# Patient Record
Sex: Female | Born: 1960 | Race: Black or African American | Hispanic: No | Marital: Married | Smoking: Never smoker
Health system: Southern US, Community
[De-identification: ages and names within clinical notes are randomized; demographics above are authoritative.]

## PROBLEM LIST (undated history)

## (undated) DIAGNOSIS — N200 Calculus of kidney: Secondary | ICD-10-CM

## (undated) DIAGNOSIS — E079 Disorder of thyroid, unspecified: Secondary | ICD-10-CM

## (undated) DIAGNOSIS — E0500 Thyrotoxicosis with diffuse goiter without thyrotoxic crisis or storm: Secondary | ICD-10-CM

## (undated) DIAGNOSIS — I10 Essential (primary) hypertension: Secondary | ICD-10-CM

## (undated) HISTORY — PX: LITHOTRIPSY: SUR834

## (undated) HISTORY — PX: FOOT SURGERY: SHX648

---

## 2005-04-07 ENCOUNTER — Emergency Department: Payer: Self-pay | Admitting: Emergency Medicine

## 2005-05-23 ENCOUNTER — Emergency Department: Payer: Self-pay | Admitting: Emergency Medicine

## 2005-06-25 ENCOUNTER — Emergency Department: Payer: Self-pay | Admitting: Emergency Medicine

## 2005-10-23 ENCOUNTER — Emergency Department: Payer: Self-pay | Admitting: Emergency Medicine

## 2005-11-23 IMAGING — CR LEFT WRIST - COMPLETE 3+ VIEW
1 series · 2 of 2 positions shown · non-contrast
Comparison: none

REASON FOR EXAM: Painful wrist
COMMENTS:

PROCEDURE:     DXR - DXR WRIST LT COMP WITH OBLIQUES  - June 26, 2005  [DATE]
RESULT:     Multiple views reveal no fractures or dislocations. The joint
spaces are intact.

[Series 1: view not recorded · 0.17mm/px · 2 of 2 slices shown]
[im 1/2]
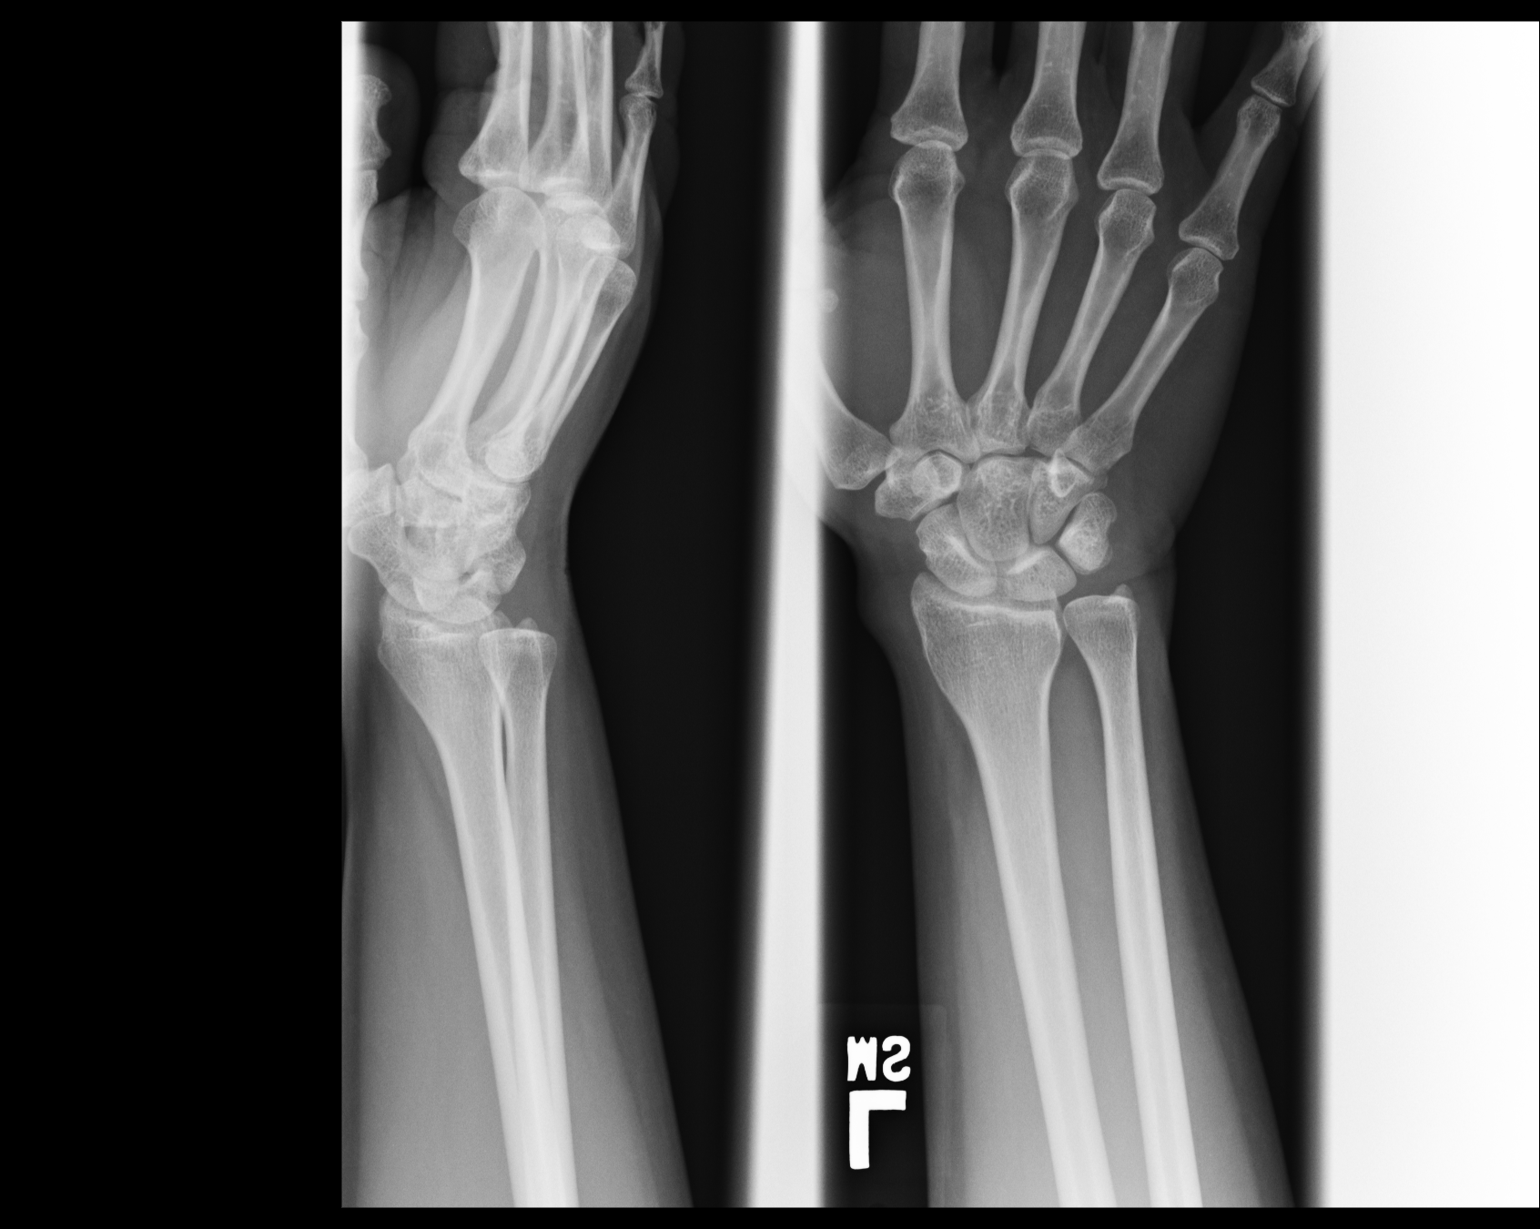
[im 2/2]
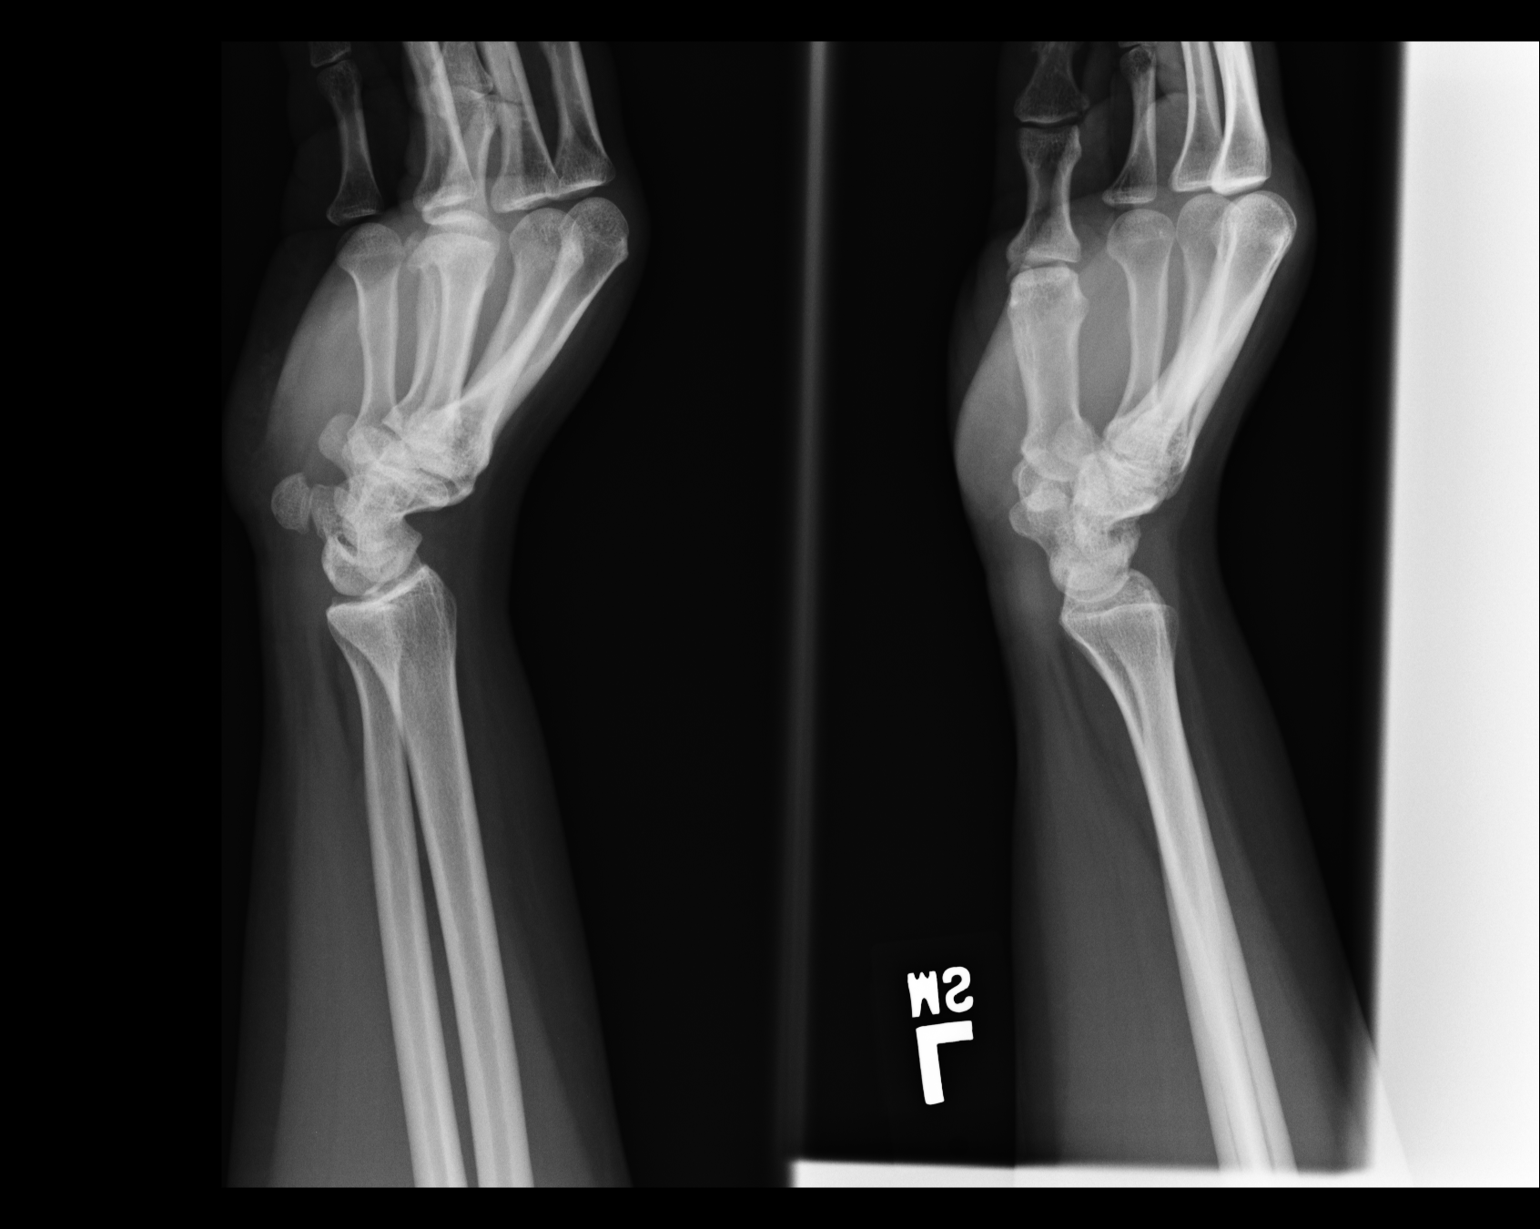

[2 of 2 positions shown; findings below may reference images not displayed]

IMPRESSION: No acute fracture is noted of the LEFT wrist.

## 2006-01-18 ENCOUNTER — Emergency Department: Payer: Self-pay | Admitting: Emergency Medicine

## 2006-02-07 ENCOUNTER — Emergency Department: Payer: Self-pay | Admitting: General Practice

## 2006-05-02 ENCOUNTER — Emergency Department (HOSPITAL_COMMUNITY): Admission: EM | Admit: 2006-05-02 | Discharge: 2006-05-02 | Payer: Self-pay | Admitting: Emergency Medicine

## 2007-07-20 ENCOUNTER — Emergency Department: Payer: Self-pay | Admitting: Emergency Medicine

## 2007-08-07 ENCOUNTER — Emergency Department: Payer: Self-pay | Admitting: Emergency Medicine

## 2008-02-19 ENCOUNTER — Emergency Department: Payer: Self-pay | Admitting: Emergency Medicine

## 2008-03-19 ENCOUNTER — Emergency Department: Payer: Self-pay | Admitting: Emergency Medicine

## 2008-07-08 ENCOUNTER — Emergency Department: Payer: Self-pay | Admitting: Emergency Medicine

## 2008-07-25 ENCOUNTER — Emergency Department: Payer: Self-pay | Admitting: Emergency Medicine

## 2008-08-16 ENCOUNTER — Emergency Department: Payer: Self-pay | Admitting: Emergency Medicine

## 2008-10-01 ENCOUNTER — Emergency Department: Payer: Self-pay | Admitting: Unknown Physician Specialty

## 2008-11-07 ENCOUNTER — Emergency Department: Payer: Self-pay | Admitting: Emergency Medicine

## 2009-01-13 IMAGING — CT CT ABD-PELV W/O CM
1 of 2 series · 15 of 32 positions shown, 19 images · non-contrast
Comparison: none

REASON FOR EXAM: (1) abd pain pt in spec 2; (2) abd pain pt in spec 2
COMMENTS:

[Series 2: stone · axial · 0.74mm/px · z∈[-934,-541]mm · 15 of 143 slices shown, 19 images]
[im 6/143  soft-tissue]
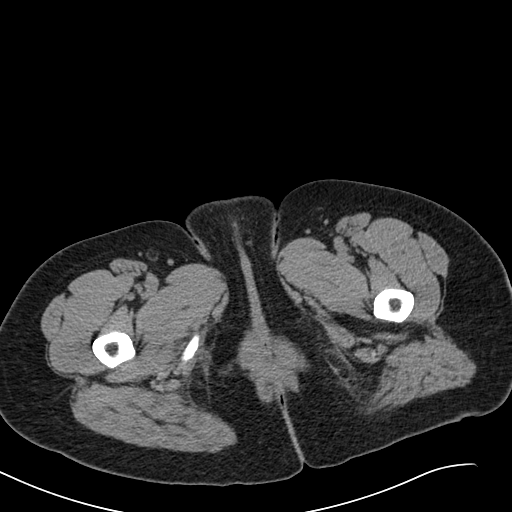
[im 6/143  bone]
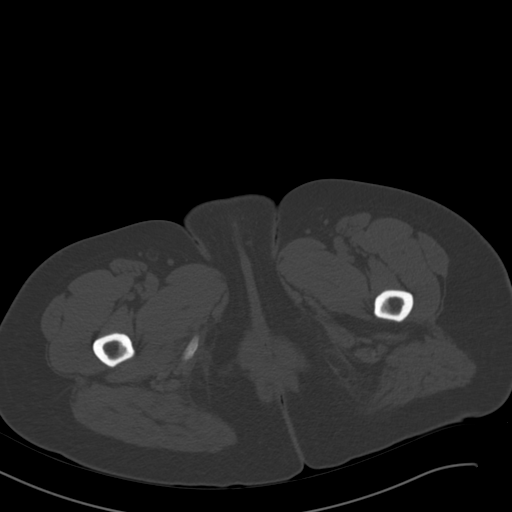
[im 18/143  soft-tissue]
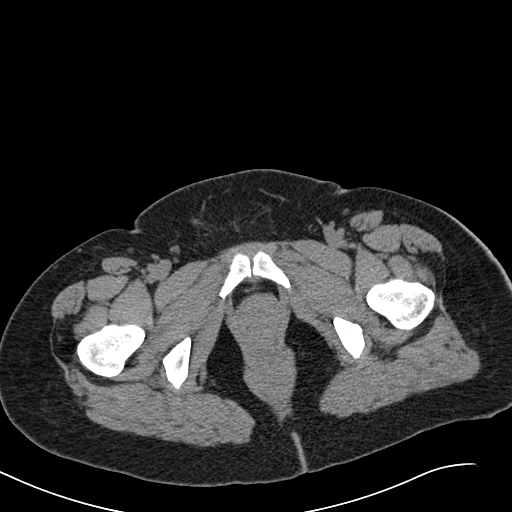
[im 29/143  soft-tissue]
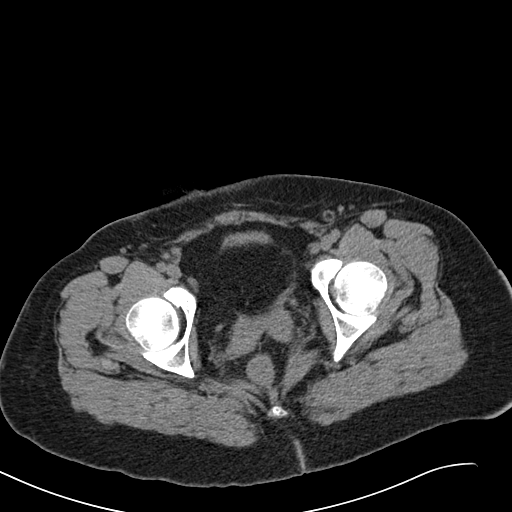
[im 40/143  soft-tissue]
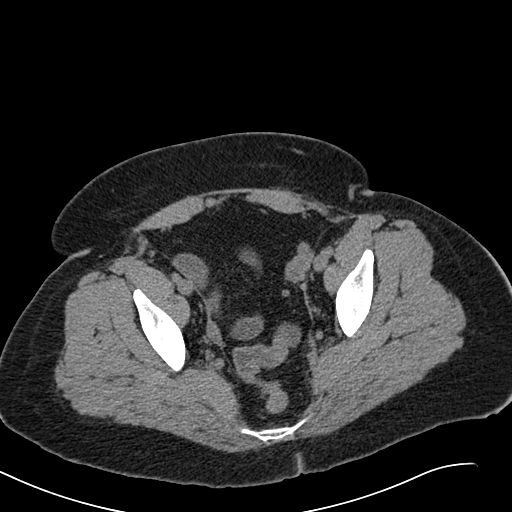
[im 52/143  soft-tissue]
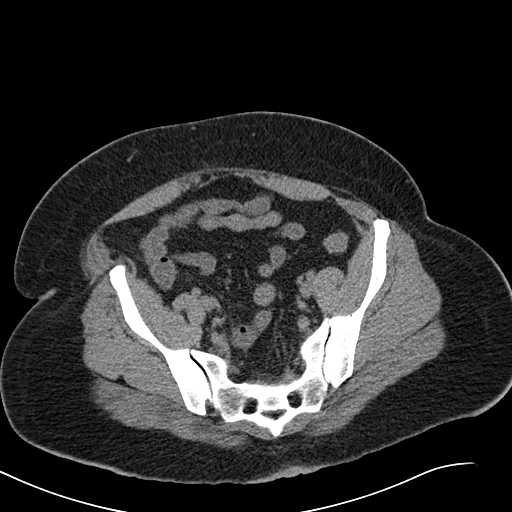
[im 63/143  soft-tissue]
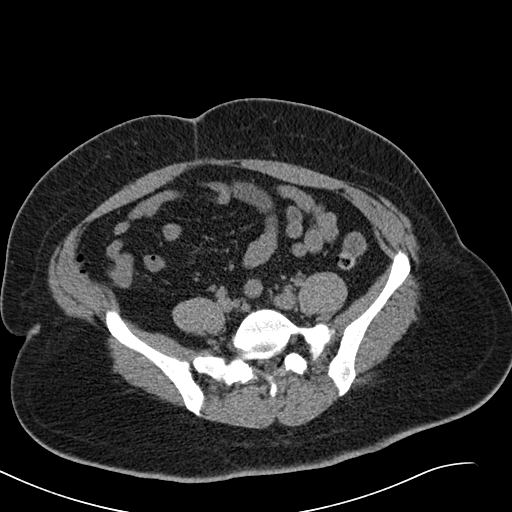
[im 74/143  soft-tissue]
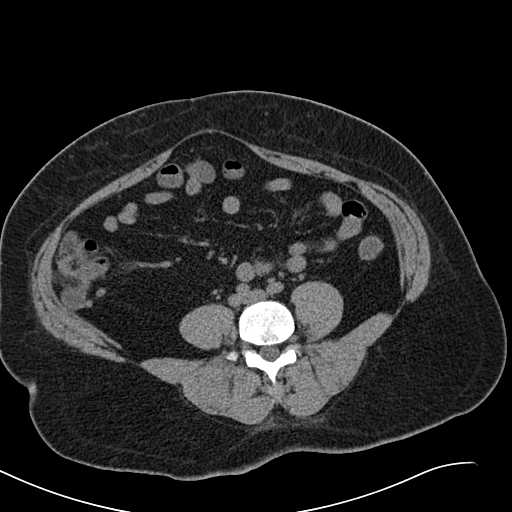
[im 80/143  soft-tissue]
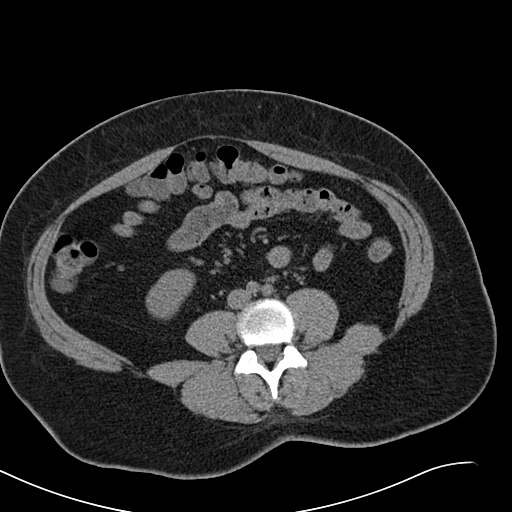
[im 91/143  soft-tissue]
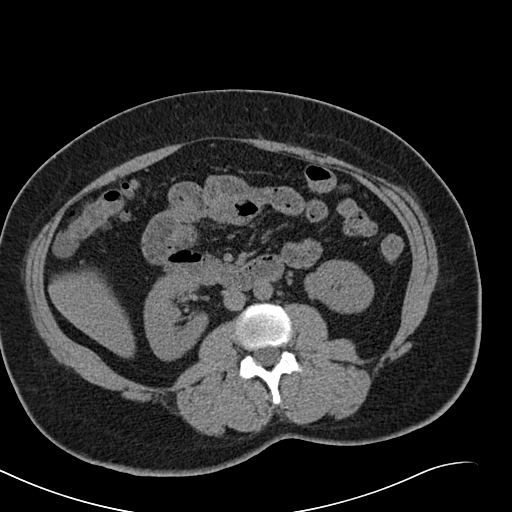
[im 91/143  bone]
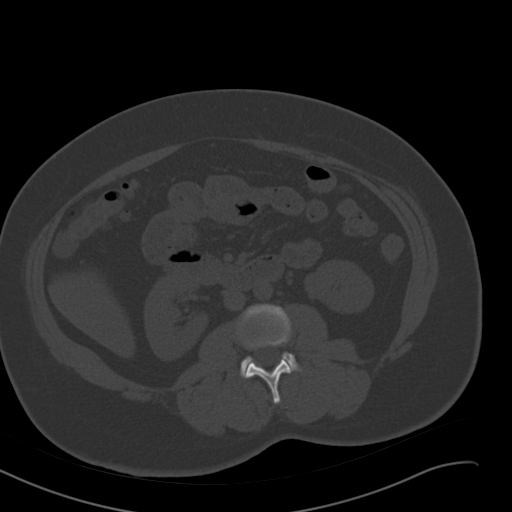
[im 103/143  soft-tissue]
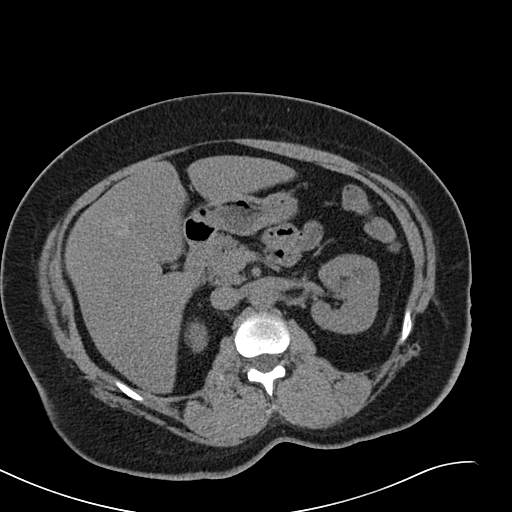
[im 114/143  soft-tissue]
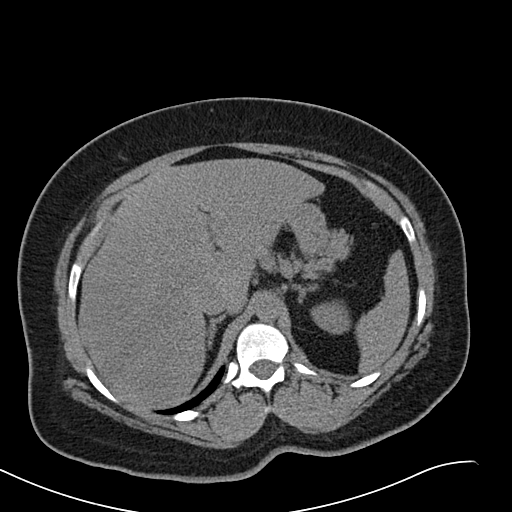
[im 120/143  lung]
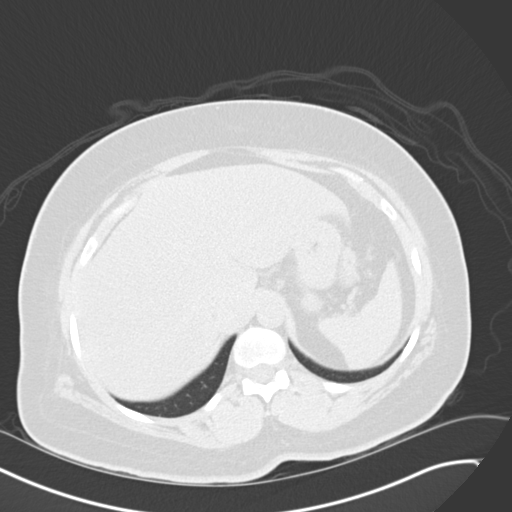
[im 125/143  soft-tissue]
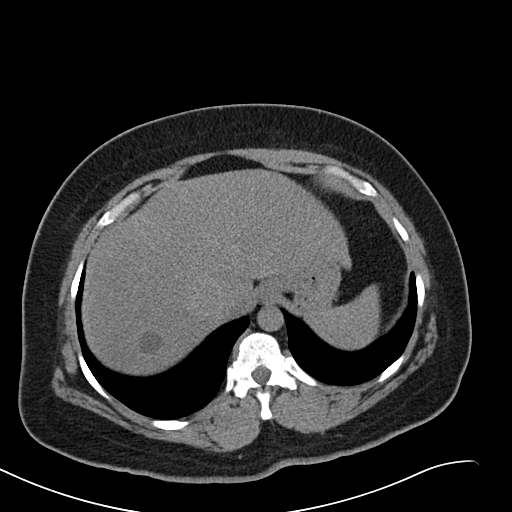
[im 125/143  lung]
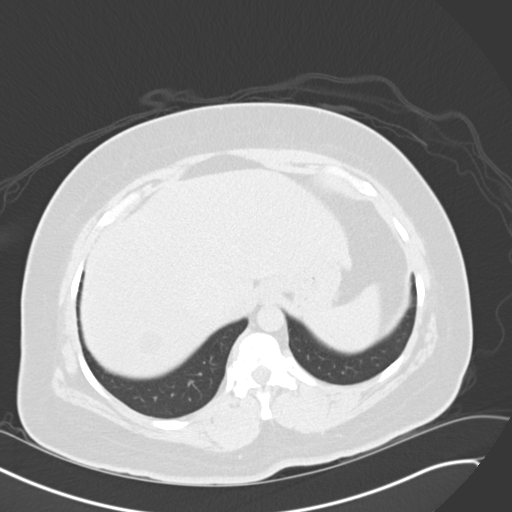
[im 131/143  lung]
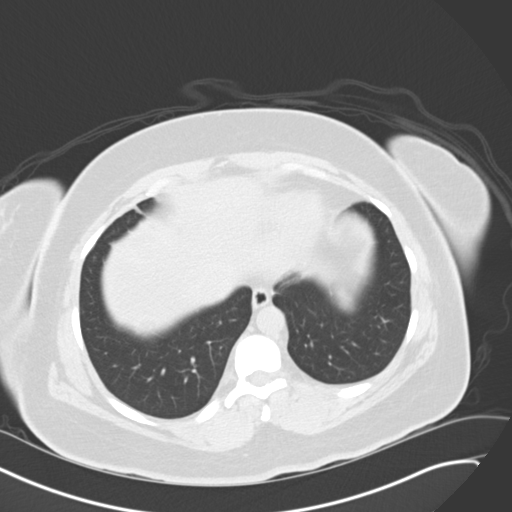
[im 137/143  soft-tissue]
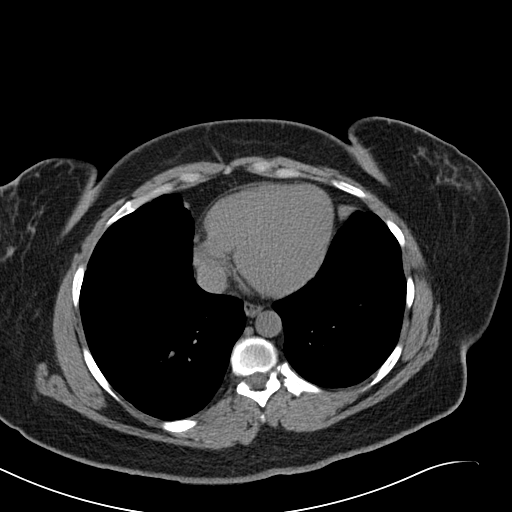
[im 137/143  lung]
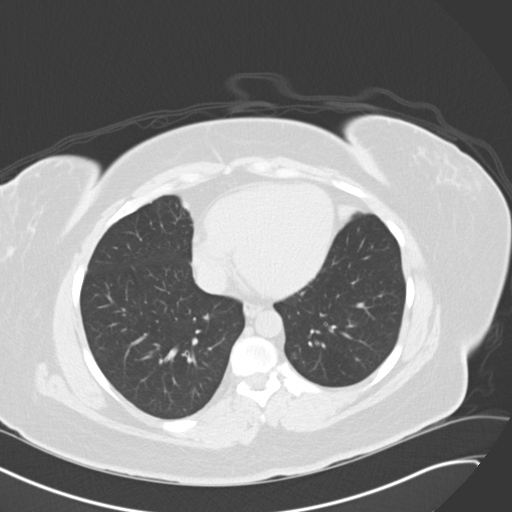

[15 of 32 positions shown; findings below may reference images not displayed]

PROCEDURE:     CT  - CT ABDOMEN AND PELVIS W[DATE] [DATE]

RESULT:     The study was performed without oral or intravenous contrast
material as requested. Comparison is made to a study dated 25 July, 2008 and 08 July, 2008.

The liver exhibits mildly decreased density diffusely which likely reflects
an element of fatty infiltration. There is a stable well circumscribed
hypodensity near the dome of the liver in the RIGHT lobe posteriorly most
compatible with a cyst. It has Hounsfield measurement of -1. It measures
approximately 2.5 x 2 cm. The spleen, nondistended stomach, pancreas,
adrenal glands, and kidneys exhibit no acute abnormality. Specifically, I do
not see evidence of obstruction of either kidney nor of calcified stones.
There is stable hypodensity in the midpole of the LEFT kidney anteriorly
measuring 1.5 cm in diameter. This has Hounsfield measurement of 4 which
suggests a cyst. The gallbladder is adequately distended with no evidence of
calcified stones. The caliber of the abdominal aorta is normal. There is no
evidence of ascites. The unopacified loops of small and large bowel exhibit
no evidence of acute obstruction nor of inflammation. There is a structure
which likely reflects a normal appendix seen in the RIGHT lower quadrant of
the abdomen. There is no evidence of acute diverticulitis. There are a few
sigmoid diverticula visible. The urinary bladder is normal in appearance.
The uterus is apparently surgically absent. I see no adnexal masses. The
lung bases are clear.
IMPRESSION: 1. I do not see evidence of acute appendicitis or other forms of acute bowel
inflammation. A few sigmoid diverticula are present but there are no
findings suspicious for acute diverticulitis.
2. I do not see evidence of acute abnormality elsewhere within the abdomen.
A fluid density structure in the RIGHT lobe of the liver appears stable and
likely reflects a subcapsular cyst.
3. I do not see evidence of urinary tract obstruction.

This report was called to the [HOSPITAL] the conclusion of the
study.

## 2009-01-30 ENCOUNTER — Emergency Department: Payer: Self-pay | Admitting: Emergency Medicine

## 2009-02-12 ENCOUNTER — Emergency Department: Payer: Self-pay | Admitting: Emergency Medicine

## 2009-03-07 ENCOUNTER — Emergency Department: Payer: Self-pay | Admitting: Emergency Medicine

## 2009-05-10 ENCOUNTER — Emergency Department: Payer: Self-pay | Admitting: Emergency Medicine

## 2009-10-28 ENCOUNTER — Emergency Department: Payer: Self-pay | Admitting: Emergency Medicine

## 2009-12-08 ENCOUNTER — Emergency Department: Payer: Self-pay | Admitting: Emergency Medicine

## 2010-03-24 ENCOUNTER — Emergency Department: Payer: Self-pay | Admitting: Emergency Medicine

## 2010-04-02 ENCOUNTER — Emergency Department: Payer: Self-pay | Admitting: Unknown Physician Specialty

## 2010-05-10 ENCOUNTER — Emergency Department: Payer: Self-pay | Admitting: Emergency Medicine

## 2010-07-10 ENCOUNTER — Emergency Department: Payer: Self-pay | Admitting: Emergency Medicine

## 2010-07-11 ENCOUNTER — Emergency Department: Payer: Self-pay | Admitting: Emergency Medicine

## 2010-09-20 ENCOUNTER — Emergency Department (HOSPITAL_COMMUNITY)
Admission: EM | Admit: 2010-09-20 | Discharge: 2010-09-21 | Payer: Self-pay | Source: Home / Self Care | Admitting: Emergency Medicine

## 2011-01-18 ENCOUNTER — Emergency Department: Payer: Self-pay | Admitting: Emergency Medicine

## 2011-03-09 ENCOUNTER — Emergency Department: Payer: Self-pay | Admitting: Emergency Medicine

## 2011-07-09 ENCOUNTER — Emergency Department: Payer: Self-pay | Admitting: Emergency Medicine

## 2011-10-14 ENCOUNTER — Emergency Department: Payer: Self-pay | Admitting: *Deleted

## 2011-10-24 ENCOUNTER — Ambulatory Visit: Payer: Self-pay | Admitting: Urology

## 2011-10-28 ENCOUNTER — Emergency Department: Payer: Self-pay | Admitting: Emergency Medicine

## 2011-10-31 ENCOUNTER — Ambulatory Visit: Payer: Self-pay | Admitting: Urology

## 2011-11-04 ENCOUNTER — Emergency Department: Payer: Self-pay | Admitting: Unknown Physician Specialty

## 2011-11-07 ENCOUNTER — Ambulatory Visit: Payer: Self-pay | Admitting: Urology

## 2011-11-12 ENCOUNTER — Ambulatory Visit: Payer: Self-pay | Admitting: Urology

## 2012-01-09 ENCOUNTER — Emergency Department: Payer: Self-pay | Admitting: Unknown Physician Specialty

## 2012-01-09 LAB — COMPREHENSIVE METABOLIC PANEL
Albumin: 4.3 g/dL (ref 3.4–5.0)
BUN: 9 mg/dL (ref 7–18)
Chloride: 105 mmol/L (ref 98–107)
EGFR (African American): >60
Glucose: 88 mg/dL (ref 65–99)
SGOT(AST): 29 U/L (ref 15–37)
SGPT (ALT): 26 U/L
Total Protein: 7.3 g/dL (ref 6.4–8.2)

## 2012-01-09 LAB — CBC
HCT: 39.9 % (ref 35.0–47.0)
HGB: 13.6 g/dL (ref 12.0–16.0)
MCH: 31.7 pg (ref 26.0–34.0)
MCHC: 34 g/dL (ref 32.0–36.0)
MCV: 93 fL (ref 80–100)

## 2012-03-23 ENCOUNTER — Emergency Department: Payer: Self-pay | Admitting: *Deleted

## 2012-03-23 LAB — AMYLASE: Amylase: 93 U/L (ref 25–115)

## 2012-03-23 LAB — COMPREHENSIVE METABOLIC PANEL
Albumin: 3.7 g/dL (ref 3.4–5.0)
BUN: 9 mg/dL (ref 7–18)
Bilirubin,Total: 0.4 mg/dL (ref 0.2–1.0)
Calcium, Total: 8.6 mg/dL (ref 8.5–10.1)
Co2: 27 mmol/L (ref 21–32)
Creatinine: 0.6 mg/dL (ref 0.60–1.30)
EGFR (African American): >60
EGFR (Non-African Amer.): >60
Glucose: 71 mg/dL (ref 65–99)
Potassium: 3.6 mmol/L (ref 3.5–5.1)
SGOT(AST): 29 U/L (ref 15–37)
Sodium: 141 mmol/L (ref 136–145)
Total Protein: 7.4 g/dL (ref 6.4–8.2)

## 2012-03-23 LAB — CBC
HCT: 39.6 % (ref 35.0–47.0)
HGB: 12.9 g/dL (ref 12.0–16.0)
MCH: 30.3 pg (ref 26.0–34.0)
MCHC: 32.7 g/dL (ref 32.0–36.0)
MCV: 93 fL (ref 80–100)
Platelet: 283 10*3/uL (ref 150–440)
RBC: 4.27 10*6/uL (ref 3.80–5.20)
RDW: 14 % (ref 11.5–14.5)
WBC: 6.1 10*3/uL (ref 3.6–11.0)

## 2012-03-23 LAB — LIPASE, BLOOD: Lipase: 176 U/L (ref 73–393)

## 2012-03-23 LAB — URINALYSIS, COMPLETE
Bacteria: NONE SEEN
Bilirubin,UR: NEGATIVE
Blood: NEGATIVE
Ketone: NEGATIVE
Leukocyte Esterase: NEGATIVE
RBC,UR: 4 /HPF (ref 0–5)
Squamous Epithelial: 4

## 2012-06-08 ENCOUNTER — Emergency Department (HOSPITAL_COMMUNITY): Payer: Medicare Other

## 2012-06-08 ENCOUNTER — Emergency Department (HOSPITAL_COMMUNITY)
Admission: EM | Admit: 2012-06-08 | Discharge: 2012-06-08 | Disposition: A | Discharge status: Home / Self Care | Payer: Medicare Other | Attending: Emergency Medicine | Admitting: Emergency Medicine

## 2012-06-08 ENCOUNTER — Encounter (HOSPITAL_COMMUNITY): Payer: Self-pay | Admitting: *Deleted

## 2012-06-08 DIAGNOSIS — E079 Disorder of thyroid, unspecified: Secondary | ICD-10-CM | POA: Insufficient documentation

## 2012-06-08 DIAGNOSIS — R1011 Right upper quadrant pain: Secondary | ICD-10-CM | POA: Insufficient documentation

## 2012-06-08 DIAGNOSIS — E876 Hypokalemia: Secondary | ICD-10-CM | POA: Insufficient documentation

## 2012-06-08 DIAGNOSIS — R109 Unspecified abdominal pain: Secondary | ICD-10-CM

## 2012-06-08 DIAGNOSIS — I10 Essential (primary) hypertension: Secondary | ICD-10-CM | POA: Insufficient documentation

## 2012-06-08 HISTORY — DX: Calculus of kidney: N20.0

## 2012-06-08 HISTORY — DX: Thyrotoxicosis with diffuse goiter without thyrotoxic crisis or storm: E05.00

## 2012-06-08 HISTORY — DX: Essential (primary) hypertension: I10

## 2012-06-08 HISTORY — DX: Disorder of thyroid, unspecified: E07.9

## 2012-06-08 LAB — CBC WITH DIFFERENTIAL/PLATELET
Basophils Absolute: 0.1 10*3/uL (ref 0.0–0.1)
HCT: 39.3 % (ref 36.0–46.0)
Lymphocytes Relative: 45 % (ref 12–46)
Neutro Abs: 2.5 10*3/uL (ref 1.7–7.7)
Neutrophils Relative %: 40 % — ABNORMAL LOW (ref 43–77)
Platelets: 263 10*3/uL (ref 150–400)
RDW: 14.3 % (ref 11.5–15.5)
WBC: 6.1 10*3/uL (ref 4.0–10.5)

## 2012-06-08 LAB — URINALYSIS, ROUTINE W REFLEX MICROSCOPIC
Leukocytes, UA: NEGATIVE
Nitrite: NEGATIVE
Specific Gravity, Urine: 1.015 (ref 1.005–1.030)
pH: 7.5 (ref 5.0–8.0)

## 2012-06-08 LAB — HEPATIC FUNCTION PANEL
AST: 24 U/L (ref 0–37)
Albumin: 4.3 g/dL (ref 3.5–5.2)

## 2012-06-08 LAB — POCT I-STAT, CHEM 8
BUN: 9 mg/dL (ref 6–23)
Creatinine, Ser: 1 mg/dL (ref 0.50–1.10)
Hemoglobin: 13.6 g/dL (ref 12.0–15.0)
Potassium: 3.2 mEq/L — ABNORMAL LOW (ref 3.5–5.1)
Sodium: 142 mEq/L (ref 135–145)

## 2012-06-08 LAB — LIPASE, BLOOD: Lipase: 34 U/L (ref 11–59)

## 2012-06-08 MED ORDER — MORPHINE SULFATE 4 MG/ML IJ SOLN
8.0000 mg | Freq: Once | INTRAMUSCULAR | Status: AC
  Administered 2012-06-08: 8 mg via INTRAVENOUS
  Filled 2012-06-08: qty 2

## 2012-06-08 MED ORDER — OXYCODONE-ACETAMINOPHEN 5-325 MG PO TABS: 1.0000 | ORAL_TABLET | ORAL | Status: AC | PRN

## 2012-06-08 MED ORDER — ONDANSETRON HCL 4 MG/2ML IJ SOLN
4.0000 mg | Freq: Once | INTRAMUSCULAR | Status: AC
  Administered 2012-06-08: 4 mg via INTRAVENOUS
  Filled 2012-06-08: qty 2

## 2012-06-08 MED ORDER — SODIUM CHLORIDE 0.9 % IV BOLUS (SEPSIS)
1000.0000 mL | Freq: Once | INTRAVENOUS | Status: AC
  Administered 2012-06-08: 1000 mL via INTRAVENOUS

## 2012-06-08 MED ORDER — POTASSIUM CHLORIDE CRYS ER 20 MEQ PO TBCR
40.0000 meq | EXTENDED_RELEASE_TABLET | Freq: Once | ORAL | Status: AC
  Administered 2012-06-08: 40 meq via ORAL
  Filled 2012-06-08: qty 2

## 2012-06-08 NOTE — ED Notes (Signed)
Pt stated that she started having abd pain on Friday.  Intermittent Pain that radiates from umbilicus up to right chest.  Pt states she is passing gas and last BM was today and stated it was "normal not diarrhea".

## 2012-06-08 NOTE — ED Notes (Signed)
Pt states due to her radiation (and co morbidities)  her pain level that she always lives with is  "10/10"

## 2012-06-08 NOTE — ED Notes (Signed)
IV team at bedside 

## 2012-06-08 NOTE — ED Notes (Signed)
Called lab to check on results, spoke to phlebotomist together have blood available will have results soon.

## 2012-06-08 NOTE — ED Notes (Signed)
Pt is here with abdominal pain that runs up to right under chest area.  Pt reports sob.  Pt sts this is not her asthma.  PT states not able to eat.  Unable to get temp because pt has drank something cold.  Pt reports some diarrhea and vomiting

## 2012-06-08 NOTE — ED Notes (Signed)
Paged IV team a second time 

## 2012-06-08 NOTE — ED Provider Notes (Signed)
History     CSN: [id number]  Arrival date & time 06/08/12  1451   First MD Initiated Contact with Patient 06/08/12 1533      Chief Complaint  Patient presents with  . Abdominal Pain    (Consider location/radiation/quality/duration/timing/severity/associated sxs/prior treatment) Patient is a 51 y.o. female presenting with abdominal pain. The history is provided by the patient.  Abdominal Pain The primary symptoms of the illness include abdominal pain, nausea (when the pain comes) and vomiting (once this AM). The primary symptoms of the illness do not include fever or shortness of breath. The current episode started more than 2 days ago. The onset of the illness was gradual. The problem has been gradually worsening.  The abdominal pain radiates to the periumbilical region. The severity of the abdominal pain is 10/10. The abdominal pain is relieved by nothing. The abdominal pain is exacerbated by eating.  The patient states that she believes she is currently not pregnant. Additional symptoms associated with the illness include chills. Symptoms associated with the illness do not include constipation, urgency, hematuria, frequency or back pain.    Past Medical History  Diagnosis Date  . Hypertension   . Thyroid disease   . Graves disease   . Kidney stones     Past Surgical History  Procedure Date  . Foot surgery   . Lithotripsy     No family history on file.  History  Substance Use Topics  . Smoking status: Never Smoker   . Smokeless tobacco: Not on file  . Alcohol Use: No    OB History    Grav Para Term Preterm Abortions TAB SAB Ect Mult Living                  Review of Systems  Constitutional: Positive for chills. Negative for fever.  HENT: Negative for congestion, rhinorrhea and neck pain.   Eyes: Negative.   Respiratory: Positive for cough (non-productive). Negative for shortness of breath.   Cardiovascular: Positive for chest pain (radiates from abdomen).  Negative for leg swelling.  Gastrointestinal: Positive for nausea (when the pain comes), vomiting (once this AM) and abdominal pain. Negative for constipation, blood in stool and abdominal distention.  Genitourinary: Negative for urgency, frequency, hematuria, decreased urine volume and difficulty urinating.  Musculoskeletal: Negative for back pain.  Skin: Negative.   Neurological: Negative for syncope, weakness, light-headedness and headaches.  Psychiatric/Behavioral: Negative for confusion.  All other systems reviewed and are negative.    Allergies  Dilaudid  Home Medications  No current outpatient prescriptions on file.  BP 206/136  Pulse 68  Resp 18  SpO2 100%  Physical Exam  Constitutional: She is oriented to person, place, and time. She appears well-developed and well-nourished. No distress.  HENT:  Head: Normocephalic and atraumatic.  Right Ear: External ear normal.  Left Ear: External ear normal.  Nose: Nose normal.  Mouth/Throat: Oropharynx is clear and moist.  Neck: Neck supple.  Cardiovascular: Normal rate, regular rhythm, normal heart sounds and intact distal pulses.   Pulmonary/Chest: Effort normal. No respiratory distress. She has no decreased breath sounds. She has wheezes (faint expiratory wheezes). She has no rales.  Abdominal: Soft. Normal appearance. She exhibits no distension. There is tenderness in the right upper quadrant and epigastric area. There is negative Murphy's sign.       Abd tenderness in RUQ, epigastric and mildly in periumbilical  Musculoskeletal: She exhibits no edema.  Lymphadenopathy:    She has no cervical adenopathy.  Neurological:  She is alert and oriented to person, place, and time.  Skin: Skin is warm and dry. She is not diaphoretic. No pallor.    ED Course  Procedures (including critical care time)  Labs Reviewed  CBC WITH DIFFERENTIAL - Abnormal; Notable for the following:    Neutrophils Relative 40 (*)     Eosinophils  Relative 8 (*)     All other components within normal limits  POCT I-STAT, CHEM 8 - Abnormal; Notable for the following:    Potassium 3.2 (*)     All other components within normal limits  URINALYSIS, ROUTINE W REFLEX MICROSCOPIC  HEPATIC FUNCTION PANEL  LIPASE, BLOOD   US Abdomen Complete  06/08/2012  *RADIOLOGY REPORT*  Clinical Data: Right upper quadrant and epigastric pain.  ABDOMEN ULTRASOUND  Technique:  Complete abdominal ultrasound examination was performed including evaluation of the liver, gallbladder, bile ducts, pancreas, kidneys, spleen, IVC, and abdominal aorta.  Comparison: No comparison studies available.  Findings:  Gallbladder:  There is no evidence for gallstones, gallbladder wall thickening or pericholecystic fluid.  The sonographer reports no sonographic Murphy's sign.  Common Bile Duct:  Nondilated at 2 mm diameter.  Liver:  Increased, coarsened echotexture suggest fatty infiltration.  No focal mass lesion evident.  No intrahepatic biliary duct dilatation.  IVC:  Normal.  Pancreas:  Normal.  Spleen:  Normal.  Right kidney:  9.9 cm in long axis.  Normal.  Left kidney:  11.3 cm in long axis.  Normal.  Abdominal Aorta:  Mid aorta is obscured by overlying bowel gas.  IMPRESSION: Fatty changes in the liver.  Otherwise unremarkable study.  Original Report Authenticated By: ERIC A. MANSELL, M.D.   Dg Chest Port 1 View  06/08/2012  *RADIOLOGY REPORT*  Clinical Data: Chest pain, wheezing and shortness of breath.  PORTABLE CHEST - 1 VIEW  Comparison: 09/21/2010.  Findings: The cardiac silhouette, mediastinal and hilar contours are within normal limits.  The lungs are clear.  No pleural effusion.  The bony thorax is intact.  IMPRESSION: No acute cardiopulmonary findings.  Original Report Authenticated By: P. Loralie Champagne, M.D.     Date: 06/08/2012  Rate: 63  Rhythm: normal sinus rhythm  QRS Axis: normal  Intervals: normal  ST/T Wave abnormalities: nonspecific ST/T changes  Conduction  Disutrbances:none  Narrative Interpretation:   Old EKG Reviewed: none available    1. Abdominal pain   2. Hypokalemia       MDM  51 yo female with colicky peri-umbilical abd pain for 3-4 days with nausea associated. Worse with food. Feels pain is worst peri-umbilical but exam shows worse RUQ and epigastric tenderness. EKG with nonspecific findings, do not feel it's cardiac. Ultrasound negative. Based on exam do not feel needs a CT scan. Pain significantly improved with morphine and zofran, and patient feels well enough and wants to go home. Discussed return precautions, as well as need for close PCP follow up. Doubt appendicitis based on atypical story/exam. Diverticulitis, ischemia and colitis also unlikely.        Pricilla Loveless, MD 06/08/12 [phone]

## 2012-06-09 NOTE — ED Provider Notes (Signed)
I saw and evaluated the patient, reviewed the resident's note and I agree with the findings and plan.   Lyanne Co, MD 06/09/12 [phone]

## 2012-07-13 ENCOUNTER — Emergency Department: Payer: Self-pay | Admitting: Emergency Medicine

## 2013-02-26 ENCOUNTER — Emergency Department: Payer: Self-pay | Admitting: Emergency Medicine

## 2013-03-24 ENCOUNTER — Emergency Department: Payer: Self-pay | Admitting: Unknown Physician Specialty

## 2013-03-24 LAB — COMPREHENSIVE METABOLIC PANEL
Alkaline Phosphatase: 75 U/L (ref 50–136)
Anion Gap: 7 (ref 7–16)
BUN: 6 mg/dL — ABNORMAL LOW (ref 7–18)
Chloride: 104 mmol/L (ref 98–107)
EGFR (African American): >60
Glucose: 123 mg/dL — ABNORMAL HIGH (ref 65–99)
Osmolality: 280 (ref 275–301)
SGOT(AST): 36 U/L (ref 15–37)
SGPT (ALT): 47 U/L (ref 12–78)
Total Protein: 7.8 g/dL (ref 6.4–8.2)

## 2013-03-24 LAB — CBC
HCT: 42 % (ref 35.0–47.0)
HGB: 14.6 g/dL (ref 12.0–16.0)
MCHC: 34.7 g/dL (ref 32.0–36.0)
MCV: 87 fL (ref 80–100)
RBC: 4.82 10*6/uL (ref 3.80–5.20)
RDW: 15.4 % — ABNORMAL HIGH (ref 11.5–14.5)

## 2013-03-24 LAB — TROPONIN I: Troponin-I: <0.02 ng/mL

## 2014-03-23 ENCOUNTER — Emergency Department: Payer: Self-pay | Admitting: Emergency Medicine

## 2014-06-01 ENCOUNTER — Emergency Department: Payer: Self-pay | Admitting: Emergency Medicine

## 2014-06-01 LAB — URINALYSIS, COMPLETE
Bacteria: NONE SEEN
Bilirubin,UR: NEGATIVE
Blood: NEGATIVE
Glucose,UR: NEGATIVE mg/dL (ref 0–75)
KETONE: NEGATIVE
Leukocyte Esterase: NEGATIVE
NITRITE: NEGATIVE
Ph: 7 (ref 4.5–8.0)
Protein: NEGATIVE
RBC,UR: 1 /HPF (ref 0–5)
SPECIFIC GRAVITY: 1.016 (ref 1.003–1.030)
Squamous Epithelial: 1
WBC UR: 1 /HPF (ref 0–5)

## 2014-11-04 HISTORY — PX: LAPAROSCOPIC GASTRIC SLEEVE RESECTION: SHX5895

## 2015-02-26 NOTE — Op Note (Signed)
PATIENT NAME:  Beth Dawson, Beth Dawson MR#:  [id number] DATE OF BIRTH:  1961-06-17  DATE OF PROCEDURE:  11/12/2011  PREOPERATIVE DIAGNOSIS: Left ureterolithiasis.   POSTOPERATIVE DIAGNOSIS: Left ureterolithiasis with passed stone.   PROCEDURE: Left Ureteroscopy   SURGEON: Denice Bors. Jacqlyn Larsen, MD   ANESTHESIA: Laryngeal mask airway anesthesia.   INDICATIONS: The patient is a 54 year old African American female who initially presented with an approximate 4 x 6 mm distal left ureteral calculus. She was having significant pain and discomfort. She underwent ESWL with some reduction in the size of the stone. She has had failure of progression. She has had continued pain and discomfort with failure of stone progression. She presents for ureteroscopic stone removal.   PROCEDURE: After informed consent was obtained, the patient was taken to the operating room and placed in the dorsal lithotomy position under laryngeal mask airway anesthesia. The patient was then prepped and draped in the usual standard fashion. The 42 French rigid cystoscope was introduced into the urethra under direct vision with no urethral abnormalities noted. Upon entering the bladder, the mucosa was inspected in its entirety with no gross mucosal lesions noted. Bilateral ureteral orifices were well visualized with no right ureteral orifice abnormalities appreciated. The right orifice was noted to be slightly raised and erythematous with some bruising surrounding. This is suspicious for possible stone passage. A guidewire was advanced into the left ureteral orifice and advanced into the upper pole collecting system without difficulty. No resistance was met. The cystoscope was removed. The 6 French rigid ureteroscope was advanced into the urinary bladder. It was easily advanced into the left ureteral orifice. An area of mild edema was noted just inside of the orifice. The scope was advanced to the level of the ureteropelvic junction with no evidence of  stone or other abnormalities appreciated. The ureter was reinspected upon withdrawal of the scope. There was no evidence of stone. This is consistent with recent stone passage despite the patient's lack of awareness of stone passage. The ureteroscope was removed. The cystoscope was replaced back into the urinary bladder. The bladder was drained. The cystoscope was removed. The patient was returned to the supine position and awakened from laryngeal mask airway anesthesia. She was taken to the recovery room in stable condition. There were no problems or complications. The patient tolerated the procedure well.   ____________________________ Denice Bors. Jacqlyn Larsen, MD bsc:drc D: 11/12/2011 21:53:43 ET T: 11/13/2011 08:48:01 ET JOB#: [id number]  cc: Denice Bors. Jacqlyn Larsen, MD, <Dictator> Denice Bors Kasia Trego MD ELECTRONICALLY SIGNED 11/19/2011 7:11

## 2015-11-19 ENCOUNTER — Emergency Department
Admission: EM | Admit: 2015-11-19 | Discharge: 2015-11-19 | Disposition: A | Discharge status: Home / Self Care | Payer: Medicare HMO | Attending: Emergency Medicine | Admitting: Emergency Medicine

## 2015-11-19 ENCOUNTER — Emergency Department: Payer: Medicare HMO

## 2015-11-19 ENCOUNTER — Encounter: Payer: Self-pay | Admitting: Emergency Medicine

## 2015-11-19 DIAGNOSIS — Z7982 Long term (current) use of aspirin: Secondary | ICD-10-CM | POA: Diagnosis not present

## 2015-11-19 DIAGNOSIS — N23 Unspecified renal colic: Secondary | ICD-10-CM | POA: Insufficient documentation

## 2015-11-19 DIAGNOSIS — Z791 Long term (current) use of non-steroidal anti-inflammatories (NSAID): Secondary | ICD-10-CM | POA: Insufficient documentation

## 2015-11-19 DIAGNOSIS — Z79899 Other long term (current) drug therapy: Secondary | ICD-10-CM | POA: Insufficient documentation

## 2015-11-19 DIAGNOSIS — R1031 Right lower quadrant pain: Secondary | ICD-10-CM | POA: Diagnosis present

## 2015-11-19 DIAGNOSIS — Z7951 Long term (current) use of inhaled steroids: Secondary | ICD-10-CM | POA: Insufficient documentation

## 2015-11-19 DIAGNOSIS — I10 Essential (primary) hypertension: Secondary | ICD-10-CM | POA: Insufficient documentation

## 2015-11-19 DIAGNOSIS — R319 Hematuria, unspecified: Secondary | ICD-10-CM

## 2015-11-19 LAB — COMPREHENSIVE METABOLIC PANEL
ALBUMIN: 4 g/dL (ref 3.5–5.0)
ALK PHOS: 68 U/L (ref 38–126)
ALT: 31 U/L (ref 14–54)
AST: 40 U/L (ref 15–41)
Anion gap: 5 (ref 5–15)
BILIRUBIN TOTAL: 0.7 mg/dL (ref 0.3–1.2)
BUN: 10 mg/dL (ref 6–20)
CALCIUM: 9.4 mg/dL (ref 8.9–10.3)
CO2: 29 mmol/L (ref 22–32)
CREATININE: 1.09 mg/dL — AB (ref 0.44–1.00)
Chloride: 108 mmol/L (ref 101–111)
GFR calc Af Amer: >60 mL/min (ref >60)
GFR, EST NON AFRICAN AMERICAN: 57 mL/min — AB (ref >60)
GLUCOSE: 91 mg/dL (ref 65–99)
Potassium: 3.6 mmol/L (ref 3.5–5.1)
Sodium: 142 mmol/L (ref 135–145)
Total Protein: 6.9 g/dL (ref 6.5–8.1)

## 2015-11-19 LAB — CBC
HEMATOCRIT: 41.3 % (ref 35.0–47.0)
HEMOGLOBIN: 13.4 g/dL (ref 12.0–16.0)
MCH: 30.1 pg (ref 26.0–34.0)
MCHC: 32.4 g/dL (ref 32.0–36.0)
MCV: 92.9 fL (ref 80.0–100.0)
Platelets: 203 10*3/uL (ref 150–440)
RBC: 4.44 MIL/uL (ref 3.80–5.20)
RDW: 13.6 % (ref 11.5–14.5)
WBC: 7.3 10*3/uL (ref 3.6–11.0)

## 2015-11-19 LAB — URINALYSIS COMPLETE WITH MICROSCOPIC (ARMC ONLY)
BACTERIA UA: NONE SEEN
Bilirubin Urine: NEGATIVE
Glucose, UA: NEGATIVE mg/dL
KETONES UR: NEGATIVE mg/dL
Leukocytes, UA: NEGATIVE
NITRITE: NEGATIVE
PH: 5 (ref 5.0–8.0)
PROTEIN: 100 mg/dL — AB
Specific Gravity, Urine: 1.025 (ref 1.005–1.030)

## 2015-11-19 LAB — LIPASE, BLOOD: Lipase: 26 U/L (ref 11–51)

## 2015-11-19 MED ORDER — CIPROFLOXACIN HCL 500 MG PO TABS
500.0000 mg | ORAL_TABLET | Freq: Once | ORAL | Status: AC
  Administered 2015-11-19: 500 mg via ORAL

## 2015-11-19 MED ORDER — CIPROFLOXACIN HCL 500 MG PO TABS: 500.0000 mg | ORAL_TABLET | Freq: Two times a day (BID) | ORAL | Status: AC

## 2015-11-19 MED ORDER — FENTANYL CITRATE (PF) 100 MCG/2ML IJ SOLN
INTRAMUSCULAR | Status: AC
  Administered 2015-11-19: 50 ug via INTRAVENOUS
  Filled 2015-11-19: qty 2

## 2015-11-19 MED ORDER — DIPHENHYDRAMINE HCL 50 MG/ML IJ SOLN
25.0000 mg | Freq: Once | INTRAMUSCULAR | Status: AC
  Administered 2015-11-19: 50 mg via INTRAVENOUS

## 2015-11-19 MED ORDER — OXYCODONE HCL 5 MG PO TABS: 5.0000 mg | ORAL_TABLET | Freq: Three times a day (TID) | ORAL | Status: AC | PRN

## 2015-11-19 MED ORDER — FENTANYL CITRATE (PF) 100 MCG/2ML IJ SOLN
50.0000 ug | Freq: Once | INTRAMUSCULAR | Status: AC
  Administered 2015-11-19: 50 ug via INTRAVENOUS

## 2015-11-19 MED ORDER — CIPROFLOXACIN HCL 500 MG PO TABS
ORAL_TABLET | ORAL | Status: AC
  Administered 2015-11-19: 500 mg via ORAL
  Filled 2015-11-19: qty 1

## 2015-11-19 MED ORDER — FENTANYL CITRATE (PF) 100 MCG/2ML IJ SOLN
50.0000 ug | Freq: Once | INTRAMUSCULAR | Status: AC
Start: 2015-11-19 — End: 2015-11-19
  Administered 2015-11-19: 50 ug via INTRAVENOUS
  Filled 2015-11-19: qty 2

## 2015-11-19 MED ORDER — ONDANSETRON HCL 4 MG/2ML IJ SOLN
4.0000 mg | Freq: Once | INTRAMUSCULAR | Status: AC
  Administered 2015-11-19: 4 mg via INTRAVENOUS

## 2015-11-19 NOTE — ED Notes (Signed)
Pt denies allergy to fentanyl but then after given the med she requested benadryl so she "wouldn't get hives".

## 2015-11-19 NOTE — ED Notes (Signed)
Pt reports for past 2 weeks blood in urine RLQ abd pain. Unknown fever but reports sweats and chills.

## 2015-11-19 NOTE — ED Provider Notes (Addendum)
[facility] Emergency Department Provider Note  ____________________________________________  Time seen: Approximately 5:19 PM  I have reviewed the triage vital signs and the nursing notes.   HISTORY  Chief Complaint Abdominal Pain and Hematuria    HPI Beth Dawson is a 55 y.o. female who complains of about 2 weeks of right lower quadrant pain which is getting worse and blood in the urine. She does not complain of dysuria. She does not have any back pain. She does say she is been feeling hot and cold and getting sweaty. He's been nauseated. The pain is now severe in nature and just achy severe pain.   Past Medical History  Diagnosis Date  . Hypertension   . Thyroid disease   . Graves disease   . Kidney stones     There are no active problems to display for this patient.   Past Surgical History  Procedure Laterality Date  . Foot surgery    . Lithotripsy    . Laparoscopic gastric sleeve resection  2016    Current Outpatient Rx  Name  Route  Sig  Dispense  Refill  . amLODipine (NORVASC) 2.5 MG tablet   Oral   Take 2.5 mg by mouth 2 (two) times daily.         Marland Kitchen. aspirin 81 MG chewable tablet   Oral   Chew 81 mg by mouth daily.         Marland Kitchen. buPROPion (WELLBUTRIN) 100 MG tablet   Oral   Take 100 mg by mouth 2 (two) times daily.         . carvedilol (COREG) 25 MG tablet   Oral   Take 25 mg by mouth 2 (two) times daily with a meal.         . ciprofloxacin (CIPRO) 500 MG tablet   Oral   Take 1 tablet (500 mg total) by mouth 2 (two) times daily.   14 tablet   0   . diclofenac (VOLTAREN) 75 MG EC tablet   Oral   Take 75 mg by mouth 2 (two) times daily.         . Fluticasone-Salmeterol (ADVAIR) 500-50 MCG/DOSE AEPB   Inhalation   Inhale 1 puff into the lungs every 12 (twelve) hours.         Marland Kitchen. levothyroxine (SYNTHROID, LEVOTHROID) 100 MCG tablet   Oral   Take 100 mcg by mouth 3 (three) times daily.         Marland Kitchen. lidocaine  (LIDODERM) 5 %   Transdermal   Place 1 patch onto the skin daily. On lower back; Remove & Discard patch within 12 hours or as directed by MD         . lisinopril (PRINIVIL,ZESTRIL) 40 MG tablet   Oral   Take 40 mg by mouth daily.         Marland Kitchen. oxyCODONE (ROXICODONE) 5 MG immediate release tablet   Oral   Take 1 tablet (5 mg total) by mouth every 8 (eight) hours as needed.   20 tablet   0   . QUEtiapine (SEROQUEL) 50 MG tablet   Oral   Take 50 mg by mouth daily.         Marland Kitchen. tiZANidine (ZANAFLEX) 4 MG capsule   Oral   Take 4 mg by mouth 2 (two) times daily as needed. For spasms           Allergies Acetaminophen and Dilaudid  No family history on file.  Social  History Social History  Substance Use Topics  . Smoking status: Never Smoker   . Smokeless tobacco: Never Used  . Alcohol Use: No    Review of Systems Constitutional: No fever/chills she does however feel hot and cold and sweaty. Eyes: No visual changes. ENT: No sore throat. Cardiovascular: Denies chest pain. Respiratory: Denies shortness of breath. Gastrointestinal: See history of present illness Genitourinary: Negative for dysuria. Musculoskeletal: Negative for back pain. Skin: Negative for rash. Neurological: Negative for headaches, focal weakness or numbness.  10-point ROS otherwise negative.  ____________________________________________   PHYSICAL EXAM:  VITAL SIGNS: ED Triage Vitals  Enc Vitals Group     BP 11/19/15 1517 169/107 mmHg     Pulse Rate 11/19/15 1517 78     Resp 11/19/15 1517 20     Temp 11/19/15 1517 98.1 F (36.7 C)     Temp Source 11/19/15 1517 Oral     SpO2 11/19/15 1517 96 %     Weight 11/19/15 1517 142 lb (64.411 kg)     Height 11/19/15 1517 5\' 2"  (1.575 m)     Head Cir --      Peak Flow --      Pain Score 11/19/15 1530 10     Pain Loc --      Pain Edu? --      Excl. in GC? --     Constitutional: Alert and oriented. Well appearing but uncomfortable  looking. Eyes: Conjunctivae are normal. PERRL. EOMI. Head: Atraumatic. Nose: No congestion/rhinnorhea. Mouth/Throat: Mucous membranes are moist.  Oropharynx non-erythematous. Neck: No stridor. Cardiovascular: Normal rate, regular rhythm. Grossly normal heart sounds.  Good peripheral circulation. Respiratory: Normal respiratory effort.  No retractions. Lungs CTAB. Gastrointestinal: Soft diffusely tender especially in the right lower quadrant she's also very tender to percussion in the right lower quadrant. No distention. No abdominal bruits. No CVA tenderness. Musculoskeletal: No lower extremity tenderness nor edema.  No joint effusions. Neurologic:  Normal speech and language. No gross focal neurologic deficits are appreciated. No gait instability. Skin:  Skin is warm, dry and intact. No rash noted. Psychiatric: Mood and affect are normal. Speech and behavior are normal.  ____________________________________________   LABS (all labs ordered are listed, but only abnormal results are displayed)  Labs Reviewed  COMPREHENSIVE METABOLIC PANEL - Abnormal; Notable for the following:    Creatinine, Ser 1.09 (*)    GFR calc non Af Amer 57 (*)    All other components within normal limits  URINALYSIS COMPLETEWITH MICROSCOPIC ([facility] ONLY) - Abnormal; Notable for the following:    Color, Urine RED (*)    APPearance CLOUDY (*)    Hgb urine dipstick 3+ (*)    Protein, ur 100 (*)    Squamous Epithelial / LPF 0-5 (*)    All other components within normal limits  URINE CULTURE  LIPASE, BLOOD  CBC   ____________________________________________  EKG   ____________________________________________  RADIOLOGY CT read by radiology and reviewed by me shows 2 mm right-sided stone approaching the UVJ. ____________________________________________   PROCEDURES    ____________________________________________   INITIAL IMPRESSION / ASSESSMENT AND PLAN / ED COURSE  Pertinent labs & imaging  results that were available during my care of the patient were reviewed by me and considered in my medical decision making (see chart for details).  Patient says she can take oxycodone without any difficulty. I will prescribe her oxycodone and Cipro as she does have some white blood cells in the urine as well. She is to  follow-up with her urologist or Dr. Emily Filbert guardian and who is on-call for urology. She is to return if she is any worse and develops fever chills etc. or cannot keep down the pain medicine.  Please note I also spoke with the patient about the possibility of Cipro causing tendinitis and the need to return to the ER if she has any pain in her arms or legs. ____________________________________________   FINAL CLINICAL IMPRESSION(S) / ED DIAGNOSES  Final diagnoses:  Renal colic on right side      Arnaldo Natal, MD 11/19/15 1835  Arnaldo Natal, MD 11/19/15 [phone]

## 2015-11-21 LAB — URINE CULTURE: Special Requests: NORMAL

## 2016-07-07 ENCOUNTER — Emergency Department: Payer: Medicare HMO

## 2016-07-07 ENCOUNTER — Encounter: Payer: Self-pay | Admitting: Emergency Medicine

## 2016-07-07 ENCOUNTER — Emergency Department
Admission: EM | Admit: 2016-07-07 | Discharge: 2016-07-07 | Disposition: A | Discharge status: Home / Self Care | Payer: Medicare HMO | Attending: Student in an Organized Health Care Education/Training Program | Admitting: Student in an Organized Health Care Education/Training Program

## 2016-07-07 DIAGNOSIS — Z7982 Long term (current) use of aspirin: Secondary | ICD-10-CM | POA: Insufficient documentation

## 2016-07-07 DIAGNOSIS — I10 Essential (primary) hypertension: Secondary | ICD-10-CM | POA: Insufficient documentation

## 2016-07-07 DIAGNOSIS — J441 Chronic obstructive pulmonary disease with (acute) exacerbation: Secondary | ICD-10-CM

## 2016-07-07 DIAGNOSIS — R0602 Shortness of breath: Secondary | ICD-10-CM | POA: Diagnosis present

## 2016-07-07 DIAGNOSIS — J40 Bronchitis, not specified as acute or chronic: Secondary | ICD-10-CM | POA: Diagnosis not present

## 2016-07-07 DIAGNOSIS — Z79899 Other long term (current) drug therapy: Secondary | ICD-10-CM | POA: Diagnosis not present

## 2016-07-07 LAB — CBC WITH DIFFERENTIAL/PLATELET
BASOS ABS: 0.1 10*3/uL (ref 0–0.1)
BASOS PCT: 2 %
Eosinophils Absolute: 0.5 10*3/uL (ref 0–0.7)
Eosinophils Relative: 8 %
HEMATOCRIT: 40.6 % (ref 35.0–47.0)
HEMOGLOBIN: 14.1 g/dL (ref 12.0–16.0)
LYMPHS PCT: 29 %
Lymphs Abs: 1.9 10*3/uL (ref 1.0–3.6)
MCH: 31.3 pg (ref 26.0–34.0)
MCHC: 34.7 g/dL (ref 32.0–36.0)
MCV: 90.3 fL (ref 80.0–100.0)
MONO ABS: 0.6 10*3/uL (ref 0.2–0.9)
Monocytes Relative: 9 %
NEUTROS ABS: 3.4 10*3/uL (ref 1.4–6.5)
NEUTROS PCT: 52 %
Platelets: 192 10*3/uL (ref 150–440)
RBC: 4.5 MIL/uL (ref 3.80–5.20)
RDW: 12.1 % (ref 11.5–14.5)
WBC: 6.5 10*3/uL (ref 3.6–11.0)

## 2016-07-07 LAB — COMPREHENSIVE METABOLIC PANEL
ALBUMIN: 4 g/dL (ref 3.5–5.0)
ALT: 13 U/L — AB (ref 14–54)
AST: 19 U/L (ref 15–41)
Alkaline Phosphatase: 69 U/L (ref 38–126)
Anion gap: 6 (ref 5–15)
BILIRUBIN TOTAL: 0.9 mg/dL (ref 0.3–1.2)
BUN: 10 mg/dL (ref 6–20)
CO2: 26 mmol/L (ref 22–32)
CREATININE: 0.7 mg/dL (ref 0.44–1.00)
Calcium: 9.8 mg/dL (ref 8.9–10.3)
Chloride: 111 mmol/L (ref 101–111)
GFR calc Af Amer: >60 mL/min (ref >60)
GLUCOSE: 100 mg/dL — AB (ref 65–99)
POTASSIUM: 3.6 mmol/L (ref 3.5–5.1)
Sodium: 143 mmol/L (ref 135–145)
TOTAL PROTEIN: 7.2 g/dL (ref 6.5–8.1)

## 2016-07-07 LAB — FIBRIN DERIVATIVES D-DIMER (ARMC ONLY): Fibrin derivatives D-dimer (ARMC): 324 (ref 0–499)

## 2016-07-07 LAB — TROPONIN I: Troponin I: <0.03 ng/mL (ref <0.03)

## 2016-07-07 LAB — MAGNESIUM: Magnesium: 2 mg/dL (ref 1.7–2.4)

## 2016-07-07 MED ORDER — DOXYCYCLINE HYCLATE 50 MG PO CAPS: 100.0000 mg | ORAL_CAPSULE | Freq: Two times a day (BID) | ORAL | 0 refills | Status: AC

## 2016-07-07 MED ORDER — ALBUTEROL SULFATE (2.5 MG/3ML) 0.083% IN NEBU
5.0000 mg | INHALATION_SOLUTION | Freq: Once | RESPIRATORY_TRACT | Status: AC
  Administered 2016-07-07: 5 mg via RESPIRATORY_TRACT
  Filled 2016-07-07: qty 6

## 2016-07-07 MED ORDER — PREDNISONE 20 MG PO TABS: 40.0000 mg | ORAL_TABLET | Freq: Every day | ORAL | 0 refills | Status: AC

## 2016-07-07 MED ORDER — LEVOFLOXACIN IN D5W 500 MG/100ML IV SOLN
500.0000 mg | Freq: Once | INTRAVENOUS | Status: AC
  Administered 2016-07-07: 500 mg via INTRAVENOUS
  Filled 2016-07-07: qty 100

## 2016-07-07 MED ORDER — DIPHENHYDRAMINE HCL 50 MG/ML IJ SOLN
INTRAMUSCULAR | Status: AC
  Administered 2016-07-07: 25 mg via INTRAVENOUS
  Filled 2016-07-07: qty 1

## 2016-07-07 MED ORDER — DIPHENHYDRAMINE HCL 50 MG/ML IJ SOLN
25.0000 mg | Freq: Once | INTRAMUSCULAR | Status: AC
  Administered 2016-07-07: 25 mg via INTRAVENOUS

## 2016-07-07 MED ORDER — MORPHINE SULFATE (PF) 4 MG/ML IV SOLN
4.0000 mg | INTRAVENOUS | Status: DC | PRN
  Administered 2016-07-07: 4 mg via INTRAVENOUS
  Filled 2016-07-07: qty 1

## 2016-07-07 MED ORDER — ALBUTEROL SULFATE HFA 108 (90 BASE) MCG/ACT IN AERS: 2.0000 | INHALATION_SPRAY | Freq: Four times a day (QID) | RESPIRATORY_TRACT | 2 refills | Status: DC | PRN

## 2016-07-07 MED ORDER — METHYLPREDNISOLONE SODIUM SUCC 125 MG IJ SOLR
125.0000 mg | Freq: Once | INTRAMUSCULAR | Status: AC
  Administered 2016-07-07: 125 mg via INTRAVENOUS
  Filled 2016-07-07: qty 2

## 2016-07-07 MED ORDER — NAPROXEN 500 MG PO TABS
500.0000 mg | ORAL_TABLET | Freq: Once | ORAL | Status: AC
  Administered 2016-07-07: 500 mg via ORAL
  Filled 2016-07-07: qty 1

## 2016-07-07 MED ORDER — ONDANSETRON HCL 4 MG/2ML IJ SOLN
4.0000 mg | Freq: Once | INTRAMUSCULAR | Status: AC
  Administered 2016-07-07: 4 mg via INTRAVENOUS
  Filled 2016-07-07: qty 2

## 2016-07-07 MED ORDER — IPRATROPIUM-ALBUTEROL 0.5-2.5 (3) MG/3ML IN SOLN
3.0000 mL | Freq: Once | RESPIRATORY_TRACT | Status: AC
  Administered 2016-07-07: 3 mL via RESPIRATORY_TRACT
  Filled 2016-07-07: qty 3

## 2016-07-07 NOTE — ED Provider Notes (Signed)
[facility] Emergency Department Provider Note    First MD Initiated Contact with Patient 07/07/16 [phone]     (approximate)  I have reviewed the triage vital signs and the nursing notes.   HISTORY  Chief Complaint Chest Pain and Shortness of Breath    HPI Beth Dawson is a 55 y.o. female with a history of high blood pressure as well as reported COPD and asthma presents with 2 days of shortness of breath of one day of left-sided chest pain. Patient denies any recent illnesses. States that she was treated for COPD/bronchitis exacerbation with a Z-Pak one month prior. Had full resolution of symptoms. Denies smoking use. States that her home inhalers have not been helping for the past 2 days. Woke up this morning with significant increased shortness of breath. States that over the past 24 hours she has been having chills. Denies any pain radiating through to her back. Denies any lower extremity swelling.  She has had a nonproductive cough for 2 days.   Past Medical History:  Diagnosis Date  . Graves disease   . Hypertension   . Kidney stones   . Thyroid disease     There are no active problems to display for this patient.   Past Surgical History:  Procedure Laterality Date  . FOOT SURGERY    . LAPAROSCOPIC GASTRIC SLEEVE RESECTION  2016  . LITHOTRIPSY      Prior to Admission medications   Medication Sig Start Date End Date Taking? Authorizing Provider  amLODipine (NORVASC) 2.5 MG tablet Take 2.5 mg by mouth 2 (two) times daily.    Historical Provider, MD  aspirin 81 MG chewable tablet Chew 81 mg by mouth daily.    Historical Provider, MD  buPROPion (WELLBUTRIN) 100 MG tablet Take 100 mg by mouth 2 (two) times daily.    Historical Provider, MD  carvedilol (COREG) 25 MG tablet Take 25 mg by mouth 2 (two) times daily with a meal.    Historical Provider, MD  diclofenac (VOLTAREN) 75 MG EC tablet Take 75 mg by mouth 2 (two) times daily.    Historical  Provider, MD  Fluticasone-Salmeterol (ADVAIR) 500-50 MCG/DOSE AEPB Inhale 1 puff into the lungs every 12 (twelve) hours.    Historical Provider, MD  levothyroxine (SYNTHROID, LEVOTHROID) 100 MCG tablet Take 100 mcg by mouth 3 (three) times daily.    Historical Provider, MD  lidocaine (LIDODERM) 5 % Place 1 patch onto the skin daily. On lower back; Remove & Discard patch within 12 hours or as directed by MD    Historical Provider, MD  lisinopril (PRINIVIL,ZESTRIL) 40 MG tablet Take 40 mg by mouth daily.    Historical Provider, MD  oxyCODONE (ROXICODONE) 5 MG immediate release tablet Take 1 tablet (5 mg total) by mouth every 8 (eight) hours as needed. 11/19/15 11/18/16  Arnaldo Natal, MD  QUEtiapine (SEROQUEL) 50 MG tablet Take 50 mg by mouth daily.    Historical Provider, MD  tiZANidine (ZANAFLEX) 4 MG capsule Take 4 mg by mouth 2 (two) times daily as needed. For spasms    Historical Provider, MD    Allergies Acetaminophen and Dilaudid [hydromorphone hcl]  No family history on file.  Social History Social History  Substance Use Topics  . Smoking status: Never Smoker  . Smokeless tobacco: Never Used  . Alcohol use No    Review of Systems Patient denies headaches, rhinorrhea, blurry vision, numbness, shortness of breath, chest pain, edema, cough, abdominal pain, nausea,  vomiting, diarrhea, dysuria, fevers, rashes or hallucinations unless otherwise stated above in HPI. ____________________________________________   PHYSICAL EXAM:  VITAL SIGNS: Vitals:   07/07/16 0850 07/07/16 0852  BP:  (!) 160/110  Pulse:  88  Resp:  18  Temp: 98.3 F (36.8 C) 98.3 F (36.8 C)    Constitutional: Alert and oriented. Moderate respiratory distress Eyes: Conjunctivae are normal. PERRL. EOMI. Head: Atraumatic. Nose: No congestion/rhinnorhea. Mouth/Throat: Mucous membranes are moist.  Oropharynx non-erythematous. Neck: No stridor. Painless ROM. No cervical spine tenderness to  palpation Hematological/Lymphatic/Immunilogical: No cervical lymphadenopathy. Cardiovascular: Normal rate, regular rhythm. Grossly normal heart sounds.  Good peripheral circulation. Respiratory: Diminished breath sounds throughout with a prolonged expiratory phase mild tachypnea. Gastrointestinal: Soft and nontender. No distention. No abdominal bruits. No CVA tenderness. Genitourinary:  Musculoskeletal: No lower extremity tenderness nor edema.  No joint effusions. Neurologic:  Normal speech and language. No gross focal neurologic deficits are appreciated. No gait instability. Skin:  Skin is warm, dry and intact. No rash noted. Psychiatric: Mood and affect are normal. Speech and behavior are normal.  ____________________________________________   LABS (all labs ordered are listed, but only abnormal results are displayed)  Results for orders placed or performed during the hospital encounter of 07/07/16 (from the past 24 hour(s))  CBC with Differential/Platelet     Status: None   Collection Time: 07/07/16  8:54 AM  Result Value Ref Range   WBC 6.5 3.6 - 11.0 K/uL   RBC 4.50 3.80 - 5.20 MIL/uL   Hemoglobin 14.1 12.0 - 16.0 g/dL   HCT 16.1 09.6 - 04.5 %   MCV 90.3 80.0 - 100.0 fL   MCH 31.3 26.0 - 34.0 pg   MCHC 34.7 32.0 - 36.0 g/dL   RDW 40.9 81.1 - 91.4 %   Platelets 192 150 - 440 K/uL   Neutrophils Relative % 52 %   Neutro Abs 3.4 1.4 - 6.5 K/uL   Lymphocytes Relative 29 %   Lymphs Abs 1.9 1.0 - 3.6 K/uL   Monocytes Relative 9 %   Monocytes Absolute 0.6 0.2 - 0.9 K/uL   Eosinophils Relative 8 %   Eosinophils Absolute 0.5 0 - 0.7 K/uL   Basophils Relative 2 %   Basophils Absolute 0.1 0 - 0.1 K/uL  Comprehensive metabolic panel     Status: Abnormal   Collection Time: 07/07/16  8:54 AM  Result Value Ref Range   Sodium 143 135 - 145 mmol/L   Potassium 3.6 3.5 - 5.1 mmol/L   Chloride 111 101 - 111 mmol/L   CO2 26 22 - 32 mmol/L   Glucose, Bld 100 (H) 65 - 99 mg/dL   BUN 10 6  - 20 mg/dL   Creatinine, Ser 7.82 0.44 - 1.00 mg/dL   Calcium 9.8 8.9 - 95.6 mg/dL   Total Protein 7.2 6.5 - 8.1 g/dL   Albumin 4.0 3.5 - 5.0 g/dL   AST 19 15 - 41 U/L   ALT 13 (L) 14 - 54 U/L   Alkaline Phosphatase 69 38 - 126 U/L   Total Bilirubin 0.9 0.3 - 1.2 mg/dL   GFR calc non Af Amer >60 >60 mL/min   GFR calc Af Amer >60 >60 mL/min   Anion gap 6 5 - 15  Troponin I     Status: None   Collection Time: 07/07/16  8:54 AM  Result Value Ref Range   Troponin I <0.03 <0.03 ng/mL  Magnesium     Status: None   Collection Time:  07/07/16  8:54 AM  Result Value Ref Range   Magnesium 2.0 1.7 - 2.4 mg/dL  Fibrin derivatives D-Dimer ([facility] only)     Status: None   Collection Time: 07/07/16  9:56 AM  Result Value Ref Range   Fibrin derivatives D-dimer ([facility]) 324 0 - 499   ____________________________________________  EKG My review and personal interpretation at Time: 8:50   Indication: sob  Rate: 80  Rhythm: nsr with baseline artifact Axis: normal Other: occasional pvc, no acute ischemia ____________________________________________  RADIOLOGY  CXR my read shows no evidence of acute cardiopulmonary process.  ____________________________________________   PROCEDURES  Procedure(s) performed: none    Critical Care performed: no ____________________________________________   INITIAL IMPRESSION / ASSESSMENT AND PLAN / ED COURSE  Pertinent labs & imaging results that were available during my care of the patient were reviewed by me and considered in my medical decision making (see chart for details).  DDX: ACS, pericarditis, bronchitis, costochondritis, pna, Pe, dissection   Antony Hasteamela D Bors is a 55 y.o. who presents to the ED with 2 days of shortness of breath and nonproductive cough associated with chills in the setting of a history of COPD. Patient is afebrile. Mildly hypertensive. Also tachypneic but without any acute hypoxia.  Does have a junky sounding cough.  Presentation is most consistent with COPD exacerbation probable acute bronchitis. EKG shows no evidence of acute ischemia.  The patient will be placed on continuous pulse oximetry and telemetry for monitoring.  Laboratory evaluation will be sent to evaluate for the above complaints.   Clinical Course  Comment By Time  Patient reassessed and had significant improvement after albuterol and Atrovent. Chest discomfort has improved. Presentation not consistent with ACS is troponin is negative and EKG is without acute ischemia. Patient was given IV steroids. We'll re-dose albuterol. Willy EddyPatrick Aubrii Sharpless, MD 09/03 [phone]  Patient reassessed remains medically stable. No hypoxia. Requesting discharge home. We'll discharge home with oral antibiotics as well as steroid taper and albuterol.  Patient was able to ambulate with a steady gait without any hypoxia or dyspnea.   Stable for outpatient f/u Willy EddyPatrick Mahesh Sizemore, MD 09/03 1129     ____________________________________________   FINAL CLINICAL IMPRESSION(S) / ED DIAGNOSES  Final diagnoses:  COPD exacerbation (HCC)  Bronchitis      NEW MEDICATIONS STARTED DURING THIS VISIT:  New Prescriptions   No medications on file     Note:  This document was prepared using Dragon voice recognition software and may include unintentional dictation errors.    Willy EddyPatrick Nichollas Perusse, MD 07/07/16 [phone]

## 2016-07-07 NOTE — ED Notes (Signed)
Patient c/o itching and burning in right arm after receiving Morphine IV. RN observed redness in arm. IV flushed with 10 mL normal saline and Dr. Roxan Hockeyobinson informed. Order for IV benadryl obtained.

## 2016-07-07 NOTE — ED Triage Notes (Signed)
Pt c/o shortness of breath and chest pain that started 2 days ago. Pt has hx of COPD. Pt is soft spoken. C/o left side chest pain. No hx of MI

## 2017-05-27 ENCOUNTER — Emergency Department
Admission: EM | Admit: 2017-05-27 | Discharge: 2017-05-27 | Disposition: A | Discharge status: Home / Self Care | Payer: Medicare Other | Attending: Emergency Medicine | Admitting: Emergency Medicine

## 2017-05-27 ENCOUNTER — Encounter: Payer: Self-pay | Admitting: *Deleted

## 2017-05-27 DIAGNOSIS — Z7982 Long term (current) use of aspirin: Secondary | ICD-10-CM | POA: Insufficient documentation

## 2017-05-27 DIAGNOSIS — Z79899 Other long term (current) drug therapy: Secondary | ICD-10-CM | POA: Insufficient documentation

## 2017-05-27 DIAGNOSIS — L02414 Cutaneous abscess of left upper limb: Secondary | ICD-10-CM | POA: Diagnosis present

## 2017-05-27 DIAGNOSIS — I10 Essential (primary) hypertension: Secondary | ICD-10-CM | POA: Insufficient documentation

## 2017-05-27 DIAGNOSIS — L02419 Cutaneous abscess of limb, unspecified: Secondary | ICD-10-CM

## 2017-05-27 MED ORDER — SULFAMETHOXAZOLE-TRIMETHOPRIM 800-160 MG PO TABS: 1.0000 | ORAL_TABLET | Freq: Two times a day (BID) | ORAL | 0 refills | Status: DC
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
## 2017-05-27 NOTE — ED Triage Notes (Signed)
Pt presents with abscess to left shoulder that pt reports "busted open" last week. Pt has bandage applied upon arrival. Bandage removed and no drainage noted. No heat noted and no redness around the area. Pt denies fevers.

## 2017-05-27 NOTE — ED Notes (Signed)
See triage note  States she developed a possible abscess area under left shoulder area about 2 weeks ago  Area started to drain yesterday

## 2017-06-06 ENCOUNTER — Emergency Department
Admission: EM | Admit: 2017-06-06 | Discharge: 2017-06-07 | Disposition: A | Discharge status: Home / Self Care | Payer: Medicare Other | Attending: Emergency Medicine | Admitting: Emergency Medicine

## 2017-06-06 ENCOUNTER — Other Ambulatory Visit: Payer: Self-pay

## 2017-06-06 ENCOUNTER — Emergency Department: Payer: Medicare Other

## 2017-06-06 ENCOUNTER — Encounter: Payer: Self-pay | Admitting: Emergency Medicine

## 2017-06-06 DIAGNOSIS — I10 Essential (primary) hypertension: Secondary | ICD-10-CM | POA: Insufficient documentation

## 2017-06-06 DIAGNOSIS — Z79899 Other long term (current) drug therapy: Secondary | ICD-10-CM | POA: Diagnosis not present

## 2017-06-06 DIAGNOSIS — Z791 Long term (current) use of non-steroidal anti-inflammatories (NSAID): Secondary | ICD-10-CM | POA: Diagnosis not present

## 2017-06-06 DIAGNOSIS — J4541 Moderate persistent asthma with (acute) exacerbation: Secondary | ICD-10-CM | POA: Insufficient documentation

## 2017-06-06 DIAGNOSIS — R030 Elevated blood-pressure reading, without diagnosis of hypertension: Secondary | ICD-10-CM

## 2017-06-06 DIAGNOSIS — Z7982 Long term (current) use of aspirin: Secondary | ICD-10-CM | POA: Insufficient documentation

## 2017-06-06 DIAGNOSIS — R0602 Shortness of breath: Secondary | ICD-10-CM | POA: Diagnosis present

## 2017-06-06 DIAGNOSIS — J45901 Unspecified asthma with (acute) exacerbation: Secondary | ICD-10-CM

## 2017-06-06 LAB — BASIC METABOLIC PANEL
ANION GAP: 7 (ref 5–15)
BUN: 10 mg/dL (ref 6–20)
CO2: 24 mmol/L (ref 22–32)
Calcium: 9.6 mg/dL (ref 8.9–10.3)
Chloride: 111 mmol/L (ref 101–111)
Creatinine, Ser: 0.89 mg/dL (ref 0.44–1.00)
GFR calc Af Amer: >60 mL/min (ref >60)
GFR calc non Af Amer: >60 mL/min (ref >60)
Glucose, Bld: 101 mg/dL — ABNORMAL HIGH (ref 65–99)
POTASSIUM: 3.9 mmol/L (ref 3.5–5.1)
Sodium: 142 mmol/L (ref 135–145)

## 2017-06-06 LAB — CBC
HEMATOCRIT: 41.8 % (ref 35.0–47.0)
Hemoglobin: 14 g/dL (ref 12.0–16.0)
MCH: 30.5 pg (ref 26.0–34.0)
MCHC: 33.4 g/dL (ref 32.0–36.0)
MCV: 91.3 fL (ref 80.0–100.0)
Platelets: 231 10*3/uL (ref 150–440)
RBC: 4.58 MIL/uL (ref 3.80–5.20)
RDW: 13.2 % (ref 11.5–14.5)
WBC: 7.9 10*3/uL (ref 3.6–11.0)

## 2017-06-06 LAB — TROPONIN I: Troponin I: <0.03 ng/mL (ref <0.03)

## 2017-06-06 MED ORDER — IPRATROPIUM-ALBUTEROL 0.5-2.5 (3) MG/3ML IN SOLN
3.0000 mL | Freq: Once | RESPIRATORY_TRACT | Status: AC
  Administered 2017-06-06: 3 mL via RESPIRATORY_TRACT
  Filled 2017-06-06: qty 3

## 2017-06-06 MED ORDER — KETOROLAC TROMETHAMINE 30 MG/ML IJ SOLN
15.0000 mg | Freq: Once | INTRAMUSCULAR | Status: AC
  Administered 2017-06-06: 15 mg via INTRAVENOUS
  Filled 2017-06-06: qty 1

## 2017-06-06 MED ORDER — METHYLPREDNISOLONE SODIUM SUCC 125 MG IJ SOLR
125.0000 mg | Freq: Once | INTRAMUSCULAR | Status: AC
  Administered 2017-06-06: 125 mg via INTRAVENOUS
  Filled 2017-06-06: qty 2

## 2017-06-06 MED ORDER — ALBUTEROL SULFATE (2.5 MG/3ML) 0.083% IN NEBU
5.0000 mg | INHALATION_SOLUTION | Freq: Once | RESPIRATORY_TRACT | Status: AC
  Administered 2017-06-06: 5 mg via RESPIRATORY_TRACT
  Filled 2017-06-06: qty 6

## 2017-06-06 MED ORDER — MAGNESIUM SULFATE 2 GM/50ML IV SOLN
2.0000 g | Freq: Once | INTRAVENOUS | Status: AC
  Administered 2017-06-06: 2 g via INTRAVENOUS
  Filled 2017-06-06: qty 50

## 2017-06-06 NOTE — ED Triage Notes (Signed)
Pt ambulatory to triage in NAD, report sob and productive cough x 2 days, used inhaler at home w/o relief, pt wheezing in triage

## 2017-06-06 NOTE — Discharge Instructions (Addendum)
Please take all of your steroids as prescribed and make sure you use a spacer whenever you are using her inhaler. Return to the emergency department for any concerns such as worsening shortness of breath, fevers, chills, or for any other issues whatsoever.  It was a pleasure to take care of you today, and thank you for coming to our emergency department.  If you have any questions or concerns before leaving please ask the nurse to grab me and I'm more than happy to go through your aftercare instructions again.  If you were prescribed any opioid pain medication today such as Norco, Vicodin, Percocet, morphine, hydrocodone, or oxycodone please make sure you do not drive when you are taking this medication as it can alter your ability to drive safely.  If you have any concerns once you are home that you are not improving or are in fact getting worse before you can make it to your follow-up appointment, please do not hesitate to call [phone] and come back for further evaluation.  Merrily BrittleNeil Bryelle Spiewak, MD  Results for orders placed or performed during the hospital encounter of 06/06/17  Basic metabolic panel  Result Value Ref Range   Sodium 142 135 - 145 mmol/L   Potassium 3.9 3.5 - 5.1 mmol/L   Chloride 111 101 - 111 mmol/L   CO2 24 22 - 32 mmol/L   Glucose, Bld 101 (H) 65 - 99 mg/dL   BUN 10 6 - 20 mg/dL   Creatinine, Ser 1.610.89 0.44 - 1.00 mg/dL   Calcium 9.6 8.9 - 09.610.3 mg/dL   GFR calc non Af Amer >60 >60 mL/min   GFR calc Af Amer >60 >60 mL/min   Anion gap 7 5 - 15  CBC  Result Value Ref Range   WBC 7.9 3.6 - 11.0 K/uL   RBC 4.58 3.80 - 5.20 MIL/uL   Hemoglobin 14.0 12.0 - 16.0 g/dL   HCT 04.541.8 40.935.0 - 81.147.0 %   MCV 91.3 80.0 - 100.0 fL   MCH 30.5 26.0 - 34.0 pg   MCHC 33.4 32.0 - 36.0 g/dL   RDW 91.413.2 78.211.5 - 95.614.5 %   Platelets 231 150 - 440 K/uL  Troponin I  Result Value Ref Range   Troponin I <0.03 <0.03 ng/mL   Dg Chest 2 View  Result Date: 06/06/2017 CLINICAL DATA:  Shortness of breath,  cough, chest tightness EXAM: CHEST  2 VIEW COMPARISON:  07/07/2016 FINDINGS: Lungs are clear.  No pleural effusion or pneumothorax. The heart is normal in size. Visualized osseous structures are within normal limits. IMPRESSION: Normal chest radiographs. Electronically Signed   By: Charline BillsSriyesh  Krishnan M.D.   On: 06/06/2017 22:19

## 2017-06-06 NOTE — ED Provider Notes (Signed)
[facility] Emergency Department Provider Note  ____________________________________________   First MD Initiated Contact with Patient 06/06/17 2325     (approximate)  I have reviewed the triage vital signs and the nursing notes.   HISTORY  Chief Complaint Shortness of Breath    HPI Beth Dawson is a 56 y.o. female who presents to the emergency Department with roughly 24 hours of shortness of breath. She has a long-standing history of asthma since childhood. No history of COPD and has never smoked. She has never required endotracheal intubation. She woke up this morning with generalized malaise and a hoarse voice. She does have a cough but it is nonproductive. No sick contacts at home. She's had no leg swelling. She does report moderate severity diffuse upper chest tightness worse when trying to take a deep breath and improved when resting. She has no history of coronary artery disease. She is using her home albuterol with minimal relief.She has a family history of hypertension.   Past Medical History:  Diagnosis Date  . Graves disease   . Hypertension   . Kidney stones   . Thyroid disease     There are no active problems to display for this patient.   Past Surgical History:  Procedure Laterality Date  . FOOT SURGERY    . LAPAROSCOPIC GASTRIC SLEEVE RESECTION  2016  . LITHOTRIPSY      Prior to Admission medications   Medication Sig Start Date End Date Taking? Authorizing Provider  albuterol (PROVENTIL HFA;VENTOLIN HFA) 108 (90 Base) MCG/ACT inhaler Inhale 2 puffs into the lungs every 6 (six) hours as needed for wheezing or shortness of breath. 06/07/17   Merrily Brittle, MD  amLODipine (NORVASC) 2.5 MG tablet Take 2.5 mg by mouth 2 (two) times daily.    [provider]  aspirin 81 MG chewable tablet Chew 81 mg by mouth daily.    [provider]  buPROPion (WELLBUTRIN) 100 MG tablet Take 100 mg by mouth 2 (two) times daily.     [provider]  carvedilol (COREG) 25 MG tablet Take 25 mg by mouth 2 (two) times daily with a meal.    [provider]  diclofenac (VOLTAREN) 75 MG EC tablet Take 75 mg by mouth 2 (two) times daily.    [provider]  Fluticasone-Salmeterol (ADVAIR) 500-50 MCG/DOSE AEPB Inhale 1 puff into the lungs every 12 (twelve) hours.    [provider]  levothyroxine (SYNTHROID, LEVOTHROID) 100 MCG tablet Take 100 mcg by mouth 3 (three) times daily.    [provider]  lidocaine (LIDODERM) 5 % Place 1 patch onto the skin daily. On lower back; Remove & Discard patch within 12 hours or as directed by MD    [provider]  lisinopril (PRINIVIL,ZESTRIL) 40 MG tablet Take 40 mg by mouth daily.    [provider]  predniSONE (DELTASONE) 10 MG tablet Take 5 tablets (50 mg total) by mouth daily. 06/07/17 06/11/17  Merrily Brittle, MD  QUEtiapine (SEROQUEL) 50 MG tablet Take 50 mg by mouth daily.    [provider]  Spacer/Aero Chamber Mouthpiece MISC 1 Units by Does not apply route every 4 (four) hours as needed (wheezing). 06/07/17   Merrily Brittle, MD  sulfamethoxazole-trimethoprim (BACTRIM DS,SEPTRA DS) 800-160 MG tablet Take 1 tablet by mouth 2 (two) times daily. 05/27/17   Cuthriell, Delorise Royals, PA-C  tiZANidine (ZANAFLEX) 4 MG capsule Take 4 mg by mouth 2 (two) times daily as needed. For spasms  [provider]    Allergies Acetaminophen and Dilaudid [hydromorphone hcl]  History reviewed. No pertinent family history.  Social History Social History  Substance Use Topics  . Smoking status: Never Smoker  . Smokeless tobacco: Never Used  . Alcohol use No    Review of Systems Constitutional: No fever/chills Eyes: No visual changes. ENT: No sore throat. Cardiovascular: Positive chest pain. Respiratory: Positive shortness of breath. Gastrointestinal: No abdominal pain.  No nausea, no vomiting.  No diarrhea.  No  constipation. Genitourinary: Negative for dysuria. Musculoskeletal: Negative for back pain. Skin: Negative for rash. Neurological: Negative for headaches, focal weakness or numbness.   ____________________________________________   PHYSICAL EXAM:  VITAL SIGNS: ED Triage Vitals  Enc Vitals Group     BP 06/06/17 2142 (!) 202/128     Pulse Rate 06/06/17 2142 68     Resp 06/06/17 2142 (!) 24     Temp 06/06/17 2142 98.8 F (37.1 C)     Temp Source 06/06/17 2142 Oral     SpO2 06/06/17 2142 94 %     Weight 06/06/17 2143 138 lb (62.6 kg)     Height 06/06/17 2143 5' (1.524 m)     Head Circumference --      Peak Flow --      Pain Score 06/06/17 2142 10     Pain Loc --      Pain Edu? --      Excl. in GC? --     Constitutional: Alert and oriented 4 no acute distress speaks with a hoarse voice Eyes: PERRL EOMI. Head: Atraumatic. Nose: No congestion/rhinnorhea. Mouth/Throat: No trismus Neck: No stridor.   Cardiovascular: Normal rate, regular rhythm. Grossly normal heart sounds.  Good peripheral circulation. Respiratory: Increased respiratory rate no accessory muscle use diffuse expiratory wheezing with prolonged expiratory phase although moving good air Gastrointestinal: Soft nontender Musculoskeletal: No lower extremity edema   Neurologic:  Normal speech and language. No gross focal neurologic deficits are appreciated. Skin:  Skin is warm, dry and intact. No rash noted. Psychiatric: Mood and affect are normal. Speech and behavior are normal.    ____________________________________________   DIFFERENTIAL includes but not limited to  Asthma, COPD, pulmonary embolus, acute coronary syndrome, pneumothorax, pneumonia ____________________________________________   LABS (all labs ordered are listed, but only abnormal results are displayed)  Labs Reviewed  BASIC METABOLIC PANEL - Abnormal; Notable for the following:       Result Value   Glucose, Bld 101 (*)    All other  components within normal limits  CBC  TROPONIN I    No signs of acute ischemia __________________________________________  EKG  ED ECG REPORT I, Merrily BrittleNeil Mandel Seiden, the attending physician, personally viewed and interpreted this ECG.  Date: 06/06/2017 Rate: 77 Rhythm: normal sinus rhythm QRS Axis: normal Intervals: normal ST/T Wave abnormalities: normal Narrative Interpretation: unremarkable  ____________________________________________  RADIOLOGY  Chest x-ray with no acute disease ____________________________________________   PROCEDURES  Procedure(s) performed: no  Procedures  Critical Care performed: no  Observation: yes  ----------------------------------------- 2340 on 06/06/2017 -----------------------------------------   OBSERVATION CARE: This patient is being placed under observation care for the following reasons: Asthmatic requiring repeat treatments and serial exams to determine response to treatment   ____________________________________________   INITIAL IMPRESSION / ASSESSMENT AND PLAN / ED COURSE  Pertinent labs & imaging results that were available during my care of the patient were reviewed by me and considered in my medical decision making (see chart for details).  The patient arrives moderately  short of breath and speaking in short sentences. She is significantly wheezy. I will begin with 3 nebulizations magnesium over 20 minutes as well as Solu-Medrol and reevaluate. Unclear if she will require inpatient admission given the severity of her symptoms.     ----------------------------------------- 12:38 AM on 06/07/2017 -----------------------------------------  The patient is saturating 97% on room air. She still is significantly wheezy. At this point I offered the patient inpatient admission as she has had multiple nebulizations without improvement however she would prefer another several treatments and to try to go home. Disposition  still pending response to treatment. ____________________________________________    ----------------------------------------- 1:54 AM on 06/07/2017 -----------------------------------------   END OF OBSERVATION STATUS: After an appropriate period of observation, this patient is being discharged due to the following reason(s):  Improved aeration breathing normally and saturating well. I appreciate the patient's elevated blood pressure however she received a tremendous amount of beta agonists and is under stress and anxiety here in the emergency department. She is already taking antihypertensives and can follow up with her primary care physician this week for recheck.  FINAL CLINICAL IMPRESSION(S) / ED DIAGNOSES  Final diagnoses:  Elevated blood pressure reading  Hypertension, unspecified type  Moderate asthma with exacerbation, unspecified whether persistent      NEW MEDICATIONS STARTED DURING THIS VISIT:  Discharge Medication List as of 06/07/2017  1:55 AM    START taking these medications   Details  predniSONE (DELTASONE) 10 MG tablet Take 5 tablets (50 mg total) by mouth daily., Starting Sat 06/07/2017, Until Wed 06/11/2017, Print    Spacer/Aero Chamber Mouthpiece MISC 1 Units by Does not apply route every 4 (four) hours as needed (wheezing)., Starting Sat 06/07/2017, Print         Note:  This document was prepared using Dragon voice recognition software and may include unintentional dictation errors.     Merrily Brittleifenbark, Ambre Kobayashi, MD 06/07/17 [phone]

## 2017-06-07 DIAGNOSIS — I10 Essential (primary) hypertension: Secondary | ICD-10-CM | POA: Diagnosis not present

## 2017-06-07 MED ORDER — ALBUTEROL SULFATE (2.5 MG/3ML) 0.083% IN NEBU: 2.5000 mg | INHALATION_SOLUTION | Freq: Once | RESPIRATORY_TRACT | Status: DC

## 2017-06-07 MED ORDER — PREDNISONE 10 MG PO TABS: 50.0000 mg | ORAL_TABLET | Freq: Every day | ORAL | 0 refills | Status: AC

## 2017-06-07 MED ORDER — IPRATROPIUM-ALBUTEROL 0.5-2.5 (3) MG/3ML IN SOLN
3.0000 mL | Freq: Once | RESPIRATORY_TRACT | Status: AC
  Administered 2017-06-07: 3 mL via RESPIRATORY_TRACT
  Filled 2017-06-07: qty 6

## 2017-06-07 MED ORDER — ALBUTEROL SULFATE HFA 108 (90 BASE) MCG/ACT IN AERS: 2.0000 | INHALATION_SPRAY | Freq: Four times a day (QID) | RESPIRATORY_TRACT | 0 refills | Status: DC | PRN

## 2017-06-07 MED ORDER — IPRATROPIUM-ALBUTEROL 0.5-2.5 (3) MG/3ML IN SOLN
3.0000 mL | Freq: Once | RESPIRATORY_TRACT | Status: AC
  Administered 2017-06-07: 3 mL via RESPIRATORY_TRACT
  Filled 2017-06-07: qty 3

## 2017-06-07 MED ORDER — SPACER/AERO CHAMBER MOUTHPIECE MISC: 1.0000 [IU] | 0 refills | Status: AC | PRN

## 2017-06-07 NOTE — ED Notes (Signed)
Pt declines 0200 duoneb. Pt states she needs to leave.

## 2017-06-07 NOTE — ED Notes (Signed)
Pt reports improved shob. Pt appears in no acute distress, watching tv and laughing at screen. Pt continues to rate pain 10/10 in chest.

## 2017-06-07 NOTE — ED Notes (Signed)
md notified on continued hypertension, no new orders received.

## 2018-02-19 ENCOUNTER — Emergency Department: Payer: Medicare Other

## 2018-02-19 ENCOUNTER — Emergency Department
Admission: EM | Admit: 2018-02-19 | Discharge: 2018-02-19 | Disposition: A | Discharge status: Home / Self Care | Payer: Medicare Other | Attending: Emergency Medicine | Admitting: Emergency Medicine

## 2018-02-19 ENCOUNTER — Other Ambulatory Visit: Payer: Self-pay

## 2018-02-19 ENCOUNTER — Encounter: Payer: Self-pay | Admitting: Emergency Medicine

## 2018-02-19 DIAGNOSIS — R0789 Other chest pain: Secondary | ICD-10-CM | POA: Insufficient documentation

## 2018-02-19 DIAGNOSIS — I10 Essential (primary) hypertension: Secondary | ICD-10-CM | POA: Insufficient documentation

## 2018-02-19 DIAGNOSIS — Z79899 Other long term (current) drug therapy: Secondary | ICD-10-CM | POA: Diagnosis not present

## 2018-02-19 DIAGNOSIS — R062 Wheezing: Secondary | ICD-10-CM | POA: Insufficient documentation

## 2018-02-19 DIAGNOSIS — Z7982 Long term (current) use of aspirin: Secondary | ICD-10-CM | POA: Diagnosis not present

## 2018-02-19 DIAGNOSIS — R05 Cough: Secondary | ICD-10-CM | POA: Diagnosis not present

## 2018-02-19 DIAGNOSIS — R079 Chest pain, unspecified: Secondary | ICD-10-CM | POA: Diagnosis present

## 2018-02-19 DIAGNOSIS — R059 Cough, unspecified: Secondary | ICD-10-CM

## 2018-02-19 LAB — BASIC METABOLIC PANEL
ANION GAP: 7 (ref 5–15)
BUN: 14 mg/dL (ref 6–20)
CHLORIDE: 104 mmol/L (ref 101–111)
CO2: 25 mmol/L (ref 22–32)
Calcium: 8.9 mg/dL (ref 8.9–10.3)
Creatinine, Ser: 1.18 mg/dL — ABNORMAL HIGH (ref 0.44–1.00)
GFR calc Af Amer: 59 mL/min — ABNORMAL LOW (ref >60)
GFR calc non Af Amer: 51 mL/min — ABNORMAL LOW (ref >60)
GLUCOSE: 106 mg/dL — AB (ref 65–99)
POTASSIUM: 3.5 mmol/L (ref 3.5–5.1)
Sodium: 136 mmol/L (ref 135–145)

## 2018-02-19 LAB — CBC
HEMATOCRIT: 40.4 % (ref 35.0–47.0)
HEMOGLOBIN: 13.2 g/dL (ref 12.0–16.0)
MCH: 29.5 pg (ref 26.0–34.0)
MCHC: 32.7 g/dL (ref 32.0–36.0)
MCV: 90.4 fL (ref 80.0–100.0)
Platelets: 292 10*3/uL (ref 150–440)
RBC: 4.47 MIL/uL (ref 3.80–5.20)
RDW: 15.6 % — AB (ref 11.5–14.5)
WBC: 10.9 10*3/uL (ref 3.6–11.0)

## 2018-02-19 LAB — TROPONIN I
Troponin I: <0.03 ng/mL (ref <0.03)
Troponin I: <0.03 ng/mL (ref <0.03)

## 2018-02-19 LAB — BRAIN NATRIURETIC PEPTIDE: B Natriuretic Peptide: 16 pg/mL (ref 0.0–100.0)

## 2018-02-19 MED ORDER — ALBUTEROL SULFATE (2.5 MG/3ML) 0.083% IN NEBU
5.0000 mg | INHALATION_SOLUTION | Freq: Once | RESPIRATORY_TRACT | Status: AC
  Administered 2018-02-19: 5 mg via RESPIRATORY_TRACT
  Filled 2018-02-19: qty 6

## 2018-02-19 MED ORDER — ALBUTEROL SULFATE HFA 108 (90 BASE) MCG/ACT IN AERS: 2.0000 | INHALATION_SPRAY | Freq: Four times a day (QID) | RESPIRATORY_TRACT | 0 refills | Status: DC | PRN

## 2018-02-19 NOTE — ED Provider Notes (Addendum)
[facility] Emergency Department Provider Note  ____________________________________________   I have reviewed the triage vital signs and the nursing notes. Where available I have reviewed prior notes and, if possible and indicated, outside hospital notes.    HISTORY  Chief Complaint Chest Pain    HPI Beth Dawson is a 57 y.o. female with a history of Graves' disease hypertension kidney stones and thyroid disease as well as asthma presents today with a cough and wheeze.  She has had a slight cough, nonproductive mostly, and slight wheeze for the last 2 days and associate with rhinorrhea etc.  She was coughing earlier today and felt a sharp pain in the left chest wall.  Hurts when she touches the changes position, and it is okay when she is not moving.  Patient states that it happened while she was in physical active coughing.  She feels that she is having a mild "bronchitis".  She denies any abdominal pain, she has had no exertional symptoms, she denies leg swelling, she states that she is otherwise in her normal state of health except for slight cough and it hurts to cough.  When she sits still it does not hurt but when she lifts her arm up, touches the area in question, or moves it hurts.  The pain is localized to the ribs immediately underneath the left breast.     Past Medical History:  Diagnosis Date  . Graves disease   . Hypertension   . Kidney stones   . Thyroid disease     There are no active problems to display for this patient.   Past Surgical History:  Procedure Laterality Date  . FOOT SURGERY    . LAPAROSCOPIC GASTRIC SLEEVE RESECTION  2016  . LITHOTRIPSY      Prior to Admission medications   Medication Sig Start Date End Date Taking? Authorizing Provider  albuterol (PROVENTIL HFA;VENTOLIN HFA) 108 (90 Base) MCG/ACT inhaler Inhale 2 puffs into the lungs every 6 (six) hours as needed for wheezing or shortness of breath. 06/07/17    Merrily Brittle, MD  amLODipine (NORVASC) 2.5 MG tablet Take 2.5 mg by mouth 2 (two) times daily.    [provider]  aspirin 81 MG chewable tablet Chew 81 mg by mouth daily.    [provider]  buPROPion (WELLBUTRIN) 100 MG tablet Take 100 mg by mouth 2 (two) times daily.    [provider]  carvedilol (COREG) 25 MG tablet Take 25 mg by mouth 2 (two) times daily with a meal.    [provider]  diclofenac (VOLTAREN) 75 MG EC tablet Take 75 mg by mouth 2 (two) times daily.    [provider]  Fluticasone-Salmeterol (ADVAIR) 500-50 MCG/DOSE AEPB Inhale 1 puff into the lungs every 12 (twelve) hours.    [provider]  levothyroxine (SYNTHROID, LEVOTHROID) 100 MCG tablet Take 100 mcg by mouth 3 (three) times daily.    [provider]  lidocaine (LIDODERM) 5 % Place 1 patch onto the skin daily. On lower back; Remove & Discard patch within 12 hours or as directed by MD    [provider]  lisinopril (PRINIVIL,ZESTRIL) 40 MG tablet Take 40 mg by mouth daily.    [provider]  QUEtiapine (SEROQUEL) 50 MG tablet Take 50 mg by mouth daily.    [provider]  Spacer/Aero Chamber Mouthpiece MISC 1 Units by Does not apply route every 4 (four) hours as needed (wheezing). 06/07/17   Rifenbark,  Lloyd HugerNeil, MD  sulfamethoxazole-trimethoprim (BACTRIM DS,SEPTRA DS) 800-160 MG tablet Take 1 tablet by mouth 2 (two) times daily. 05/27/17   Cuthriell, Delorise RoyalsJonathan D, PA-C  tiZANidine (ZANAFLEX) 4 MG capsule Take 4 mg by mouth 2 (two) times daily as needed. For spasms    [provider]    Allergies Acetaminophen and Dilaudid [hydromorphone hcl]  History reviewed. No pertinent family history.  Social History Social History   Tobacco Use  . Smoking status: Never Smoker  . Smokeless tobacco: Never Used  Substance Use Topics  . Alcohol use: No  . Drug use: No    Review of Systems Constitutional: No fever/chills Eyes: No  visual changes. ENT: No sore throat. No stiff neck no neck pain Cardiovascular: + chest pain. Respiratory: Denies shortness of breath.  + cough Gastrointestinal:   no vomiting.  No diarrhea.  No constipation. Genitourinary: Negative for dysuria. Musculoskeletal: Negative lower extremity swelling Skin: Negative for rash. Neurological: Negative for severe headaches, focal weakness or numbness.   ____________________________________________   PHYSICAL EXAM:  VITAL SIGNS: ED Triage Vitals [02/19/18 1448]  Enc Vitals Group     BP      Pulse      Resp      Temp      Temp src      SpO2      Weight 167 lb (75.8 kg)     Height 5\' 2"  (1.575 m)     Head Circumference      Peak Flow      Pain Score 10     Pain Loc      Pain Edu?      Excl. in GC?     Constitutional: Alert and oriented. Well appearing and in no acute distress. Eyes: Conjunctivae are normal Head: Atraumatic HEENT: No congestion/rhinnorhea. Mucous membranes are moist.  Oropharynx non-erythematous Neck:   Nontender with no meningismus, no masses, no stridor Cardiovascular: Normal rate, regular rhythm. Grossly normal heart sounds.  Good peripheral circulation. Respiratory: Normal respiratory effort.  No retractions. Lungs CTAB. Abdominal: Soft and nontender. No distention. No guarding no rebound Chest: Tender palpation left chest wall and the ribs underneath the left breast, female chaperone present, when I touch this area patient is "ouch that the pain right there" and post back.  Also repeatable when she changes position pulls up in the bed.  There is no crepitus no flail chest, no rib fracture palpated.  Patient has no shingles or other lesions noted in the area. Back:  There is no focal tenderness or step off.  there is no midline tenderness there are no lesions noted. there is no CVA tenderness Musculoskeletal: No lower extremity tenderness, no upper extremity tenderness. No joint effusions, no DVT signs strong  distal pulses no edema Neurologic:  Normal speech and language. No gross focal neurologic deficits are appreciated.  Skin:  Skin is warm, dry and intact. No rash noted. Psychiatric: Mood and affect are normal. Speech and behavior are normal.  ____________________________________________   LABS (all labs ordered are listed, but only abnormal results are displayed)  Labs Reviewed  BASIC METABOLIC PANEL - Abnormal; Notable for the following components:      Result Value   Glucose, Bld 106 (*)    Creatinine, Ser 1.18 (*)    GFR calc non Af Amer 51 (*)    GFR calc Af Amer 59 (*)    All other components within normal limits  CBC - Abnormal; Notable for the following components:  RDW 15.6 (*)    All other components within normal limits  TROPONIN I  BRAIN NATRIURETIC PEPTIDE  POC URINE PREG, ED    Pertinent labs  results that were available during my care of the patient were reviewed by me and considered in my medical decision making (see chart for details). ____________________________________________  EKG  I personally interpreted any EKGs ordered by me or triage Normal sinus rhythm, rate 60 bpm no acute ST elevation or depression normal axis unremarkable he ____________________________________________  RADIOLOGY  Pertinent labs & imaging results that were available during my care of the patient were reviewed by me and considered in my medical decision making (see chart for details). If possible, patient and/or family made aware of any abnormal findings.  Dg Chest 2 View  Result Date: 02/19/2018 CLINICAL DATA:  Chest pain EXAM: CHEST - 2 VIEW COMPARISON:  June 06, 2017 FINDINGS: Lungs are clear. Heart size and pulmonary vascularity are normal. No adenopathy. No pneumothorax. No bone lesions. IMPRESSION: No edema or consolidation. Electronically Signed   By: Bretta Bang III M.D.   On: 02/19/2018 15:11   ____________________________________________     PROCEDURES  Procedure(s) performed: None  Procedures  Critical Care performed: None  ____________________________________________   INITIAL IMPRESSION / ASSESSMENT AND PLAN / ED COURSE  Pertinent labs & imaging results that were available during my care of the patient were reviewed by me and considered in my medical decision making (see chart for details).  Patient seen here today for very reproducible chest wall pain in the context of a cough which she states is actually gradually improving.  Cardiac enzymes are negative, we will repeat cardiac enzymes although very low suspicion for ACS given patient disease process and symptomatic findings of reproducible chest wall pain in the context of cough.  Chest x-ray shows no pneumonia I do not think antibiotics are indicated.  What is going on is most likely viral.  Consider but do not think likely PE, dissection, ACS myocarditis enteritis pericarditis pneumonia pneumothorax etc.  ----------------------------------------- 5:51 PM on 02/19/2018 -----------------------------------------  Resting comfortably in the bed she states she cannot take Tylenol she cannot take ibuprofen, there is really nothing else I can give to her, I did offer her Ultram, but she states "that does not ever touch my pain".  She is allergic to Dilaudid and some other morphine products, and short, at this time, patient is trying to tell me that the risks of the medications outweigh the benefits and certainly not going to challenge that at this time.    ____________________________________________   FINAL CLINICAL IMPRESSION(S) / ED DIAGNOSES  Final diagnoses:  None      This chart was dictated using voice recognition software.  Despite best efforts to proofread,  errors can occur which can change meaning.      Jeanmarie Plant, MD 02/19/18 1702    Jeanmarie Plant, MD 02/19/18 [phone]

## 2018-02-19 NOTE — ED Triage Notes (Signed)
Pt reports that 15 minutes PTA she developed left side chest pain that radiates up under her left breast. Denies any N/V or SOB but did diaphoretic.

## 2018-11-03 ENCOUNTER — Emergency Department
Admission: EM | Admit: 2018-11-03 | Discharge: 2018-11-03 | Disposition: A | Discharge status: Home / Self Care | Payer: Medicare Other | Attending: Emergency Medicine | Admitting: Emergency Medicine

## 2018-11-03 ENCOUNTER — Other Ambulatory Visit: Payer: Self-pay

## 2018-11-03 ENCOUNTER — Encounter: Payer: Self-pay | Admitting: Emergency Medicine

## 2018-11-03 ENCOUNTER — Emergency Department: Payer: Medicare Other

## 2018-11-03 DIAGNOSIS — W19XXXA Unspecified fall, initial encounter: Secondary | ICD-10-CM | POA: Diagnosis not present

## 2018-11-03 DIAGNOSIS — Y999 Unspecified external cause status: Secondary | ICD-10-CM | POA: Insufficient documentation

## 2018-11-03 DIAGNOSIS — S63501A Unspecified sprain of right wrist, initial encounter: Secondary | ICD-10-CM | POA: Diagnosis not present

## 2018-11-03 DIAGNOSIS — E0500 Thyrotoxicosis with diffuse goiter without thyrotoxic crisis or storm: Secondary | ICD-10-CM | POA: Diagnosis not present

## 2018-11-03 DIAGNOSIS — Y939 Activity, unspecified: Secondary | ICD-10-CM | POA: Insufficient documentation

## 2018-11-03 DIAGNOSIS — S6981XA Other specified injuries of right wrist, hand and finger(s), initial encounter: Secondary | ICD-10-CM | POA: Diagnosis present

## 2018-11-03 DIAGNOSIS — I10 Essential (primary) hypertension: Secondary | ICD-10-CM | POA: Insufficient documentation

## 2018-11-03 DIAGNOSIS — Z79899 Other long term (current) drug therapy: Secondary | ICD-10-CM | POA: Diagnosis not present

## 2018-11-03 DIAGNOSIS — S60221A Contusion of right hand, initial encounter: Secondary | ICD-10-CM | POA: Diagnosis not present

## 2018-11-03 DIAGNOSIS — Z7982 Long term (current) use of aspirin: Secondary | ICD-10-CM | POA: Insufficient documentation

## 2018-11-03 DIAGNOSIS — Y929 Unspecified place or not applicable: Secondary | ICD-10-CM | POA: Insufficient documentation

## 2018-11-03 NOTE — Discharge Instructions (Addendum)
Apply ice to the wrist.  Elevate above your heart to decrease the swelling and the pain.  Tylenol, ibuprofen, or Aleve would help decrease the swelling and help with pain.  Call orthopedics and make an appointment if not better in 4 to 5 days.

## 2018-11-03 NOTE — ED Provider Notes (Signed)
[facility] Emergency Department Provider Note  ____________________________________________   First MD Initiated Contact with Patient 11/03/18 1134     (approximate)  I have reviewed the triage vital signs and the nursing notes.   HISTORY  Chief Complaint Fall    HPI Beth Dawson is a 57 y.o. female presents emergency department complaint of right hand and wrist pain.  She states she fell yesterday.  States she landed on her knees and skinned them up.  She is not concerned about her knees just her right hand.  She denies any other injuries.    Past Medical History:  Diagnosis Date  . Graves disease   . Hypertension   . Kidney stones   . Thyroid disease     There are no active problems to display for this patient.   Past Surgical History:  Procedure Laterality Date  . FOOT SURGERY    . LAPAROSCOPIC GASTRIC SLEEVE RESECTION  2016  . LITHOTRIPSY      Prior to Admission medications   Medication Sig Start Date End Date Taking? Authorizing Provider  albuterol (PROVENTIL HFA;VENTOLIN HFA) 108 (90 Base) MCG/ACT inhaler Inhale 2 puffs into the lungs every 6 (six) hours as needed for wheezing or shortness of breath. 02/19/18   Jeanmarie PlantMcShane, James A, MD  amLODipine (NORVASC) 2.5 MG tablet Take 2.5 mg by mouth 2 (two) times daily.    [provider]  aspirin 81 MG chewable tablet Chew 81 mg by mouth daily.    [provider]  buPROPion (WELLBUTRIN) 100 MG tablet Take 100 mg by mouth 2 (two) times daily.    [provider]  carvedilol (COREG) 25 MG tablet Take 25 mg by mouth 2 (two) times daily with a meal.    [provider]  diclofenac (VOLTAREN) 75 MG EC tablet Take 75 mg by mouth 2 (two) times daily.    [provider]  Fluticasone-Salmeterol (ADVAIR) 500-50 MCG/DOSE AEPB Inhale 1 puff into the lungs every 12 (twelve) hours.    [provider]  levothyroxine (SYNTHROID, LEVOTHROID) 100 MCG tablet  Take 100 mcg by mouth 3 (three) times daily.    [provider]  lidocaine (LIDODERM) 5 % Place 1 patch onto the skin daily. On lower back; Remove & Discard patch within 12 hours or as directed by MD    [provider]  lisinopril (PRINIVIL,ZESTRIL) 40 MG tablet Take 40 mg by mouth daily.    [provider]  QUEtiapine (SEROQUEL) 50 MG tablet Take 50 mg by mouth daily.    [provider]  Spacer/Aero Chamber Mouthpiece MISC 1 Units by Does not apply route every 4 (four) hours as needed (wheezing). 06/07/17   Merrily Brittleifenbark, Neil, MD  sulfamethoxazole-trimethoprim (BACTRIM DS,SEPTRA DS) 800-160 MG tablet Take 1 tablet by mouth 2 (two) times daily. 05/27/17   Cuthriell, Delorise RoyalsJonathan D, PA-C  tiZANidine (ZANAFLEX) 4 MG capsule Take 4 mg by mouth 2 (two) times daily as needed. For spasms    [provider]    Allergies Acetaminophen and Dilaudid [hydromorphone hcl]  No family history on file.  Social History Social History   Tobacco Use  . Smoking status: Never Smoker  . Smokeless tobacco: Never Used  Substance Use Topics  . Alcohol use: No  . Drug use: No    Review of Systems  Constitutional: No fever/chills Eyes: No visual changes. ENT: No sore throat. Respiratory: Denies cough Genitourinary: Negative for dysuria. Musculoskeletal: Negative for back pain.  Positive  for right hand pain Skin: Negative for rash.    ____________________________________________   PHYSICAL EXAM:  VITAL SIGNS: ED Triage Vitals [11/03/18 1132]  Enc Vitals Group     BP (!) 175/107     Pulse Rate 66     Resp 20     Temp 98.3 F (36.8 C)     Temp Source Oral     SpO2 100 %     Weight 167 lb (75.8 kg)     Height 5\' 2"  (1.575 m)     Head Circumference      Peak Flow      Pain Score 10     Pain Loc      Pain Edu?      Excl. in [place]?     Constitutional: Alert and oriented. Well appearing and in no acute distress. Eyes: Conjunctivae are normal.  Head:  Atraumatic. Nose: No congestion/rhinnorhea. Mouth/Throat: Mucous membranes are moist.   Neck:  supple no lymphadenopathy noted Cardiovascular: Normal rate, regular rhythm.  Respiratory: Normal respiratory effort.  No retractions, GU: deferred Musculoskeletal: FROM all extremities, warm and well perfused, the right hand is very tender to palpation, swelling is noted, neurovascular is intact Neurologic:  Normal speech and language.  Skin:  Skin is warm, dry and intact. No rash noted. Psychiatric: Mood and affect are normal. Speech and behavior are normal.  ____________________________________________   LABS (all labs ordered are listed, but only abnormal results are displayed)  Labs Reviewed - No data to display ____________________________________________   ____________________________________________  RADIOLOGY  X-ray of the right wrist is negative for fracture  ____________________________________________   PROCEDURES  Procedure(s) performed: Velcro cock-up splint applied by the nursing staff  Procedures    ____________________________________________   INITIAL IMPRESSION / ASSESSMENT AND PLAN / ED COURSE  Pertinent labs & imaging results that were available during my care of the patient were reviewed by me and considered in my medical decision making (see chart for details).   Patient is 57 year old female presents emergency department complaining of right hand and wrist pain.  Physical exam shows a swollen tender hand and wrist.  X-ray of the right wrist is negative  Explained the findings to the patient.  She was given a Velcro cock-up splint.  She is to apply ice.  Take over-the-counter Tylenol ibuprofen.  Follow-up with orthopedics if not better in 5 to 7 days.  Return if worsening.  States she understands will comply.  She discharged stable condition.     As part of my medical decision making, I reviewed the following data within the electronic medical  record:  Nursing notes reviewed and incorporated, Old chart reviewed, Radiograph reviewed x-ray of the right wrist is negative, Notes from prior ED visits and [place] Controlled Substance Database  ____________________________________________   FINAL CLINICAL IMPRESSION(S) / ED DIAGNOSES  Final diagnoses:  Fall, initial encounter  Contusion of right hand, initial encounter  Wrist sprain, right, initial encounter      NEW MEDICATIONS STARTED DURING THIS VISIT:  New Prescriptions   No medications on file     Note:  This document was prepared using Dragon voice recognition software and may include unintentional dictation errors.    Faythe GheeFisher, Susan W, PA-C 11/03/18 1212    Dionne BucySiadecki, Sebastian, MD 11/03/18 1227
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
## 2019-10-18 ENCOUNTER — Other Ambulatory Visit: Payer: Self-pay

## 2019-10-18 ENCOUNTER — Encounter: Payer: Self-pay | Admitting: Emergency Medicine

## 2019-10-18 ENCOUNTER — Emergency Department
Admission: EM | Admit: 2019-10-18 | Discharge: 2019-10-18 | Disposition: A | Discharge status: Home / Self Care | Payer: 59 | Attending: Emergency Medicine | Admitting: Emergency Medicine

## 2019-10-18 ENCOUNTER — Emergency Department: Payer: 59

## 2019-10-18 DIAGNOSIS — Y998 Other external cause status: Secondary | ICD-10-CM | POA: Diagnosis not present

## 2019-10-18 DIAGNOSIS — S0083XA Contusion of other part of head, initial encounter: Secondary | ICD-10-CM | POA: Diagnosis not present

## 2019-10-18 DIAGNOSIS — S161XXA Strain of muscle, fascia and tendon at neck level, initial encounter: Secondary | ICD-10-CM | POA: Insufficient documentation

## 2019-10-18 DIAGNOSIS — Z7982 Long term (current) use of aspirin: Secondary | ICD-10-CM | POA: Insufficient documentation

## 2019-10-18 DIAGNOSIS — I10 Essential (primary) hypertension: Secondary | ICD-10-CM | POA: Diagnosis not present

## 2019-10-18 DIAGNOSIS — Y9389 Activity, other specified: Secondary | ICD-10-CM | POA: Diagnosis not present

## 2019-10-18 DIAGNOSIS — Z79899 Other long term (current) drug therapy: Secondary | ICD-10-CM | POA: Diagnosis not present

## 2019-10-18 DIAGNOSIS — Y92410 Unspecified street and highway as the place of occurrence of the external cause: Secondary | ICD-10-CM | POA: Insufficient documentation

## 2019-10-18 DIAGNOSIS — W2210XA Striking against or struck by unspecified automobile airbag, initial encounter: Secondary | ICD-10-CM | POA: Diagnosis not present

## 2019-10-18 DIAGNOSIS — S0990XA Unspecified injury of head, initial encounter: Secondary | ICD-10-CM | POA: Diagnosis present

## 2019-10-18 MED ORDER — TRAMADOL HCL 50 MG PO TABS
50.0000 mg | ORAL_TABLET | Freq: Once | ORAL | Status: DC
  Filled 2019-10-18: qty 1

## 2019-10-18 MED ORDER — CYCLOBENZAPRINE HCL 10 MG PO TABS: 10.0000 mg | ORAL_TABLET | Freq: Three times a day (TID) | ORAL | 0 refills | Status: AC | PRN

## 2019-10-18 MED ORDER — MORPHINE SULFATE (PF) 4 MG/ML IV SOLN
4.0000 mg | Freq: Once | INTRAVENOUS | Status: AC
  Administered 2019-10-18: 4 mg via INTRAMUSCULAR

## 2019-10-18 MED ORDER — OXYCODONE-ACETAMINOPHEN 5-325 MG PO TABS: 1.0000 | ORAL_TABLET | ORAL | 0 refills | Status: AC | PRN

## 2019-10-18 MED ORDER — MORPHINE SULFATE (PF) 4 MG/ML IV SOLN
4.0000 mg | Freq: Once | INTRAVENOUS | Status: DC
  Filled 2019-10-18: qty 1
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
## 2019-10-18 NOTE — ED Notes (Signed)
Pt refusing tramadol currently.
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
## 2019-10-18 NOTE — ED Notes (Signed)
At bedside with tramadol. Pt requesting "something in a needle...something stronger". EDP notified.
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
## 2021-09-04 DEATH — deceased
